# Patient Record
Sex: Female | Born: 1985 | Race: Black or African American | Hispanic: No | Marital: Single | Smoking: Former smoker
Health system: Southern US, Community
[De-identification: ages and names within clinical notes are randomized; demographics above are authoritative.]

## PROBLEM LIST (undated history)

## (undated) DIAGNOSIS — L02415 Cutaneous abscess of right lower limb: Secondary | ICD-10-CM

## (undated) DIAGNOSIS — N83209 Unspecified ovarian cyst, unspecified side: Secondary | ICD-10-CM

## (undated) DIAGNOSIS — Z789 Other specified health status: Secondary | ICD-10-CM

## (undated) DIAGNOSIS — L03115 Cellulitis of right lower limb: Secondary | ICD-10-CM

## (undated) HISTORY — DX: Cellulitis of right lower limb: L03.115

## (undated) HISTORY — PX: NO PAST SURGERIES: SHX2092

## (undated) HISTORY — DX: Cutaneous abscess of right lower limb: L02.415

---
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
## 2014-06-29 HISTORY — PX: WISDOM TOOTH EXTRACTION: SHX21

## 2015-01-02 ENCOUNTER — Encounter (HOSPITAL_BASED_OUTPATIENT_CLINIC_OR_DEPARTMENT_OTHER): Payer: Self-pay | Admitting: *Deleted

## 2015-01-02 ENCOUNTER — Emergency Department (HOSPITAL_BASED_OUTPATIENT_CLINIC_OR_DEPARTMENT_OTHER)
Admission: EM | Admit: 2015-01-02 | Discharge: 2015-01-02 | Disposition: A | Discharge status: Home / Self Care | Payer: Managed Care, Other (non HMO) | Attending: Emergency Medicine | Admitting: Emergency Medicine

## 2015-01-02 DIAGNOSIS — S39012A Strain of muscle, fascia and tendon of lower back, initial encounter: Secondary | ICD-10-CM

## 2015-01-02 DIAGNOSIS — Z720 Tobacco use: Secondary | ICD-10-CM | POA: Insufficient documentation

## 2015-01-02 DIAGNOSIS — Z8742 Personal history of other diseases of the female genital tract: Secondary | ICD-10-CM | POA: Insufficient documentation

## 2015-01-02 DIAGNOSIS — X58XXXA Exposure to other specified factors, initial encounter: Secondary | ICD-10-CM | POA: Diagnosis not present

## 2015-01-02 DIAGNOSIS — Z3202 Encounter for pregnancy test, result negative: Secondary | ICD-10-CM | POA: Insufficient documentation

## 2015-01-02 DIAGNOSIS — Y9389 Activity, other specified: Secondary | ICD-10-CM | POA: Insufficient documentation

## 2015-01-02 DIAGNOSIS — Y9289 Other specified places as the place of occurrence of the external cause: Secondary | ICD-10-CM | POA: Insufficient documentation

## 2015-01-02 DIAGNOSIS — S3992XA Unspecified injury of lower back, initial encounter: Secondary | ICD-10-CM | POA: Diagnosis present

## 2015-01-02 DIAGNOSIS — Y990 Civilian activity done for income or pay: Secondary | ICD-10-CM | POA: Insufficient documentation

## 2015-01-02 HISTORY — DX: Unspecified ovarian cyst, unspecified side: N83.209

## 2015-01-02 LAB — URINALYSIS, ROUTINE W REFLEX MICROSCOPIC
BILIRUBIN URINE: NEGATIVE
Glucose, UA: NEGATIVE mg/dL
HGB URINE DIPSTICK: NEGATIVE
Ketones, ur: NEGATIVE mg/dL
Leukocytes, UA: NEGATIVE
Nitrite: NEGATIVE
PROTEIN: NEGATIVE mg/dL
Specific Gravity, Urine: 1.024 (ref 1.005–1.030)
Urobilinogen, UA: 0.2 mg/dL (ref 0.0–1.0)
pH: 6 (ref 5.0–8.0)

## 2015-01-02 LAB — PREGNANCY, URINE: Preg Test, Ur: NEGATIVE

## 2015-01-02 MED ORDER — KETOROLAC TROMETHAMINE 60 MG/2ML IM SOLN
60.0000 mg | Freq: Once | INTRAMUSCULAR | Status: AC
  Administered 2015-01-02: 60 mg via INTRAMUSCULAR
  Filled 2015-01-02: qty 2

## 2015-01-02 MED ORDER — METHOCARBAMOL 500 MG PO TABS: 1000.0000 mg | ORAL_TABLET | Freq: Four times a day (QID) | ORAL | Status: DC

## 2015-01-02 MED ORDER — NAPROXEN 500 MG PO TABS: 500.0000 mg | ORAL_TABLET | Freq: Two times a day (BID) | ORAL | Status: DC

## 2015-01-02 NOTE — ED Notes (Signed)
Pt c/o back pain that began today while at work. Pt denies any heavy lifting lately.

## 2015-01-02 NOTE — Discharge Instructions (Signed)
Please read and follow all provided instructions.  Your diagnoses today include:  1. Lumbosacral strain, initial encounter    Tests performed today include:  Vital signs - see below for your results today  Medications prescribed:   Naproxen - anti-inflammatory pain medication  Do not exceed 500mg  naproxen every 12 hours, take with food  You have been prescribed an anti-inflammatory medication or NSAID. Take with food. Take smallest effective dose for the shortest duration needed for your pain. Stop taking if you experience stomach pain or vomiting.    Robaxin (methocarbamol) - muscle relaxer medication  DO NOT drive or perform any activities that require you to be awake and alert because this medicine can make you drowsy.   Take any prescribed medications only as directed.  Home care instructions:   Follow any educational materials contained in this packet  Please rest, use ice or heat on your back for the next several days  Do not lift, push, pull anything more than 10 pounds for the next week  Follow-up instructions: Please follow-up with your primary care provider in the next 1 week for further evaluation of your symptoms.   Return instructions:  SEEK IMMEDIATE MEDICAL ATTENTION IF YOU HAVE:  New numbness, tingling, weakness, or problem with the use of your arms or legs  Severe back pain not relieved with medications  Loss control of your bowels or bladder  Increasing pain in any areas of the body (such as chest or abdominal pain)  Shortness of breath, dizziness, or fainting.   Worsening nausea (feeling sick to your stomach), vomiting, fever, or sweats  Any other emergent concerns regarding your health   Additional Information:  Your vital signs today were: BP 141/88 mmHg   Pulse 88   Temp(Src) 98.6 F (37 C) (Oral)   Resp 18   Ht 5\' 6"  (1.676 m)   Wt 200 lb (90.719 kg)   BMI 32.30 kg/m2   SpO2 100%   LMP 11/19/2014 If your blood pressure (BP) was elevated  above 135/85 this visit, please have this repeated by your doctor within one month. --------------

## 2015-01-02 NOTE — ED Provider Notes (Signed)
CSN: [id number]     Arrival date & time 01/02/15  1717 History   First MD Initiated Contact with Patient 01/02/15 1726     Chief Complaint  Patient presents with  . Back Pain     (Consider location/radiation/quality/duration/timing/severity/associated sxs/prior Treatment) HPI Comments: Patient presents with complaint of lower back pain that began acutely today while at work. She was not doing any strenuous or physical activities when the pain started. Pain is better with standing. It is a squeezing pain across her lower back. It does not radiate into her legs. It is not associated with dysuria, fever, nausea or vomiting. No recent strenuous activity. Patient was concerned that she was having a miscarriage. No vaginal bleeding or discharge reported. Patient took a pregnancy test at home which was negative. Her last menstrual period was 11/19/14. No other treatments prior to arrival. Onset was acute. Course is constant.  Patient is a 29 y.o. female presenting with back pain. The history is provided by the patient.  Back Pain Associated symptoms: no dysuria, no fever, no numbness, no pelvic pain and no weakness     Past Medical History  Diagnosis Date  . Ovarian cyst    History reviewed. No pertinent past surgical history. No family history on file. History  Substance Use Topics  . Smoking status: Current Some Day Smoker  . Smokeless tobacco: Not on file  . Alcohol Use: Yes   OB History    No data available     Review of Systems  Constitutional: Negative for fever and unexpected weight change.  Gastrointestinal: Negative for constipation.       Negative for fecal incontinence.   Genitourinary: Negative for dysuria, hematuria, flank pain, vaginal bleeding, vaginal discharge and pelvic pain.       Negative for urinary incontinence or retention.  Musculoskeletal: Positive for back pain.  Neurological: Negative for weakness and numbness.       Denies saddle paresthesias.       Allergies  Review of patient's allergies indicates no known allergies.  Home Medications   Prior to Admission medications   Not on File   BP 141/88 mmHg  Pulse 88  Temp(Src) 98.6 F (37 C) (Oral)  Resp 18  Ht  (1.676 m)  Wt 200 lb (90.719 kg)  BMI 32.30 kg/m2  SpO2 100%  LMP 11/19/2014 Physical Exam  Constitutional: She appears well-developed and well-nourished.  HENT:  Head: Normocephalic and atraumatic.  Eyes: Conjunctivae are normal.  Neck: Normal range of motion. Neck supple.  Pulmonary/Chest: Effort normal.  Abdominal: Soft. There is no tenderness. There is no CVA tenderness.  Musculoskeletal: Normal range of motion.       Cervical back: She exhibits normal range of motion, no tenderness and no bony tenderness.       Thoracic back: She exhibits normal range of motion, no tenderness and no bony tenderness.       Lumbar back: She exhibits tenderness. She exhibits normal range of motion and no bony tenderness.       Back:  No step-off noted with palpation of spine.   Neurological: She is alert. She has normal strength and normal reflexes. No sensory deficit.  5/5 strength in entire lower extremities bilaterally. No sensation deficit.   Skin: Skin is warm and dry. No rash noted.  Psychiatric: She has a normal mood and affect.  Nursing note and vitals reviewed.   ED Course  Procedures (including critical care time) Labs Review Labs Reviewed  URINALYSIS,  ROUTINE W REFLEX MICROSCOPIC (NOT AT [facility]) - Abnormal; Notable for the following:    APPearance CLOUDY (*)    All other components within normal limits  PREGNANCY, URINE    Imaging Review No results found.   EKG Interpretation None       5:58 PM Patient seen and examined. Work-up initiated. Medications ordered.   Vital signs reviewed and are as follows: BP 141/88 mmHg  Pulse 88  Temp(Src) 98.6 F (37 C) (Oral)  Resp 18  Ht 5\' 6"  (1.676 m)  Wt 200 lb (90.719 kg)  BMI 32.30 kg/m2  SpO2  100%  LMP 11/19/2014  No red flag s/s of low back pain. Patient was counseled on back pain precautions and told to do activity as tolerated but do not lift, push, or pull heavy objects more than 10 pounds for the next week.  Patient counseled to use ice or heat on back for no longer than 15 minutes every hour.   Patient prescribed muscle relaxer and counseled on proper use of muscle relaxant medication.    Urged patient not to drink alcohol, drive, or perform any other activities that requires focus while taking this medication.  Patient urged to follow-up with PCP if pain does not improve with treatment and rest or if pain becomes recurrent. Urged to return with worsening severe pain, loss of bowel or bladder control, trouble walking.   The patient verbalizes understanding and agrees with the plan.   MDM   Final diagnoses:  Lumbosacral strain, initial encounter   Patient with back pain, exam suspicious for lumbosacral strain. UPT neg. No neurological deficits. Patient is ambulatory. No warning symptoms of back pain including: fecal incontinence, urinary retention or overflow incontinence, night sweats, waking from sleep with back pain, unexplained fevers or weight loss, h/o cancer, IVDU, recent trauma. No concern for cauda equina, epidural abscess, or other serious cause of back pain. Conservative measures such as rest, ice/heat and pain medicine indicated with PCP follow-up if no improvement with conservative management.      Renne CriglerJoshua Amiera Herzberg, PA-C 01/02/15 1833  Rolland PorterMark James, MD 01/03/15 [phone]

## 2016-11-25 ENCOUNTER — Encounter (HOSPITAL_BASED_OUTPATIENT_CLINIC_OR_DEPARTMENT_OTHER): Payer: Self-pay | Admitting: *Deleted

## 2016-11-25 ENCOUNTER — Emergency Department (HOSPITAL_BASED_OUTPATIENT_CLINIC_OR_DEPARTMENT_OTHER)
Admission: EM | Admit: 2016-11-25 | Discharge: 2016-11-25 | Disposition: A | Discharge status: Home / Self Care | Payer: 59 | Attending: Emergency Medicine | Admitting: Emergency Medicine

## 2016-11-25 DIAGNOSIS — B3731 Acute candidiasis of vulva and vagina: Secondary | ICD-10-CM

## 2016-11-25 DIAGNOSIS — B373 Candidiasis of vulva and vagina: Secondary | ICD-10-CM | POA: Diagnosis not present

## 2016-11-25 DIAGNOSIS — Z87891 Personal history of nicotine dependence: Secondary | ICD-10-CM | POA: Insufficient documentation

## 2016-11-25 DIAGNOSIS — N898 Other specified noninflammatory disorders of vagina: Secondary | ICD-10-CM | POA: Diagnosis present

## 2016-11-25 LAB — WET PREP, GENITAL
Clue Cells Wet Prep HPF POC: NONE SEEN
SPERM: NONE SEEN
Trich, Wet Prep: NONE SEEN

## 2016-11-25 LAB — URINALYSIS, ROUTINE W REFLEX MICROSCOPIC
BILIRUBIN URINE: NEGATIVE
GLUCOSE, UA: NEGATIVE mg/dL
KETONES UR: NEGATIVE mg/dL
NITRITE: NEGATIVE
PROTEIN: NEGATIVE mg/dL
Specific Gravity, Urine: 1.022 (ref 1.005–1.030)
pH: 6 (ref 5.0–8.0)

## 2016-11-25 LAB — PREGNANCY, URINE: PREG TEST UR: NEGATIVE

## 2016-11-25 LAB — URINALYSIS, MICROSCOPIC (REFLEX)

## 2016-11-25 MED ORDER — FLUCONAZOLE 200 MG PO TABS
200.0000 mg | ORAL_TABLET | Freq: Every day | ORAL | 0 refills | Status: AC
Start: 2016-11-25 — End: 2016-11-26

## 2016-11-25 MED ORDER — FLUCONAZOLE 100 MG PO TABS
200.0000 mg | ORAL_TABLET | Freq: Once | ORAL | Status: AC
  Administered 2016-11-25: 200 mg via ORAL
  Filled 2016-11-25: qty 2

## 2016-11-25 NOTE — ED Provider Notes (Signed)
[facility] Provider Note   CSN: [id number] Arrival date & time: 11/25/16  [id number] By signing my name below, I, Brenda Lane, attest that this documentation has been prepared under the direction and in the presence of Gwyneth SproutPlunkett, Zaim Nitta, MD . Electronically Signed: Elsie StainAaron Lane, ED Scribe. 11/25/2016. 7:55 PM.  History   Chief Complaint Chief Complaint  Patient presents with  . Vaginal Discharge    HPI Brenda Lane is a 31 y.o. female who presents to the Emergency Department complaining of constant, severe vaginal irritation and itching onset today. She reports associated watery vaginal discharge. No new soaps, lotions, or detergents. No OTC treatments tried for these symptoms PTA. Pt states she is sexually active, and has unprotected sex with the same partner of seven years. She has no current suspicion of STI. Pt reports her LNMP was on 10/31/16. Pt denies hx of STD.  Pt denies dysuria, hematuria, and abdominal pain.  The history is provided by the patient. No language interpreter was used.   Past Medical History:  Diagnosis Date  . Ovarian cyst     There are no active problems to display for this patient.   History reviewed. No pertinent surgical history.  OB History    No data available       Home Medications    Prior to Admission medications   Medication Sig Start Date End Date Taking? Authorizing Provider  naproxen (NAPROSYN) 500 MG tablet Take 1 tablet (500 mg total) by mouth 2 (two) times daily. 01/02/15   Renne CriglerGeiple, Joshua, PA-C    Family History No family history on file.  Social History Social History  Substance Use Topics  . Smoking status: Former Games developermoker  . Smokeless tobacco: Not on file  . Alcohol use Yes     Allergies   Patient has no known allergies.   Review of Systems Review of Systems All systems reviewed and are negative for acute change except as noted in the HPI.  Physical Exam Updated Vital Signs BP 127/84   Pulse 75    Temp 98.5 F (36.9 C) (Oral)   Resp 16   Ht 5\' 6"  (1.676 m)   Wt 254 lb (115.2 kg)   LMP 10/31/2016   SpO2 100%   BMI 41.00 kg/m   Physical Exam  Constitutional: She appears well-developed and well-nourished. No distress.  HENT:  Head: Normocephalic and atraumatic.  Eyes: Conjunctivae are normal.  Neck: Normal range of motion.  Cardiovascular: Normal rate.   Pulmonary/Chest: Effort normal. No respiratory distress.  Abdominal: She exhibits no distension. There is no rebound and no guarding.  Genitourinary:  Genitourinary Comments:  Chaperone present. Thick, white, curd-lke vaginal discharge with surrounding inflammation with and masceration of the skin. Full range of cervical motion and no masses or tenderness. No adnexal tenderness.   Musculoskeletal: Normal range of motion.  Neurological: She is alert.  Skin: No pallor.  Psychiatric: She has a normal mood and affect. Her behavior is normal.  Nursing note and vitals reviewed.   ED Treatments / Results  DIAGNOSTIC STUDIES:  Oxygen Saturation is 100% on RA, normal by my interpretation.    COORDINATION OF CARE:  8:10 PM Discussed treatment plan with pt at bedside and pt agreed to plan.  Labs (all labs ordered are listed, but only abnormal results are displayed) Labs Reviewed  WET PREP, GENITAL - Abnormal; Notable for the following:       Result Value   Yeast Wet Prep HPF POC PRESENT (*)  WBC, Wet Prep HPF POC MANY (*)    All other components within normal limits  URINALYSIS, ROUTINE W REFLEX MICROSCOPIC - Abnormal; Notable for the following:    APPearance CLOUDY (*)    Hgb urine dipstick TRACE (*)    Leukocytes, UA MODERATE (*)    All other components within normal limits  URINALYSIS, MICROSCOPIC (REFLEX) - Abnormal; Notable for the following:    Bacteria, UA RARE (*)    Squamous Epithelial / LPF 6-30 (*)    All other components within normal limits  PREGNANCY, URINE  GC/CHLAMYDIA PROBE AMP ([place]) NOT AT  [facility]    EKG  EKG Interpretation None       Radiology No results found.  Procedures Procedures (including critical care time)  Medications Ordered in ED Medications - No data to display   Initial Impression / Assessment and Plan / ED Course  I have reviewed the triage vital signs and the nursing notes.  Pertinent labs & imaging results that were available during my care of the patient were reviewed by me and considered in my medical decision making (see chart for details).     Patient with symptoms most consistent with a vaginal yeast infection. She does not have risky sexual behaviors and only 1 partner for 7 years. Patient was treated with Diflucan. GC and Chlamydia were sent but no evidence of PID and low suspicion.  Final Clinical Impressions(s) / ED Diagnoses   Final diagnoses:  Yeast vaginitis    New Prescriptions Discharge Medication List as of 11/25/2016  8:28 PM    START taking these medications   Details  fluconazole (DIFLUCAN) 200 MG tablet Take 1 tablet (200 mg total) by mouth daily. As needed in 3 days if still having symptoms., Starting Wed 11/25/2016, Until Thu 11/26/2016, Print        I personally performed the services described in this documentation, which was scribed in my presence.  The recorded information has been reviewed and considered.     Gwyneth Sprout, MD 11/25/16 2101

## 2016-11-25 NOTE — ED Notes (Signed)
Patient states she was fine when she got up this morning but by this afternoon noticed she had a very bad vaginal itch.  States she did not notice any discharge except the normal discharge but did notice some "bumps".  States she has been with 1 partner for 7 years

## 2016-11-25 NOTE — ED Notes (Signed)
Discharge instructions reviewed.

## 2016-11-25 NOTE — ED Notes (Signed)
Specimens sent to lab

## 2016-11-25 NOTE — ED Triage Notes (Signed)
pt c/o vaginal discharge and itching  X 2 days

## 2016-11-26 LAB — GC/CHLAMYDIA PROBE AMP (~~LOC~~) NOT AT ARMC
Chlamydia: NEGATIVE
Neisseria Gonorrhea: NEGATIVE

## 2018-05-10 LAB — OB RESULTS CONSOLE GC/CHLAMYDIA
Chlamydia: NEGATIVE
Gonorrhea: NEGATIVE

## 2018-06-20 LAB — OB RESULTS CONSOLE ABO/RH: RH Type: POSITIVE

## 2018-06-20 LAB — OB RESULTS CONSOLE HIV ANTIBODY (ROUTINE TESTING)
HIV: NONREACTIVE
HIV: NONREACTIVE

## 2018-06-20 LAB — OB RESULTS CONSOLE RPR: RPR: NONREACTIVE

## 2018-06-29 NOTE — L&D Delivery Note (Signed)
Delivery Note At 7:22 PM a viable and healthy female was delivered via Vaginal, Spontaneous (Presentation: Left occiput; transverse ).  APGAR: 8, 9; weight pending .   Placenta status: spontaneous, intact.  Cord: 3V   Anesthesia:  Epidural Episiotomy: None Lacerations: 1st degree Suture Repair: 3.0 vicryl Est. Blood Loss (mL):  100 cc  Mom to postpartum.  Baby to Couplet care / Skin to Skin.  Vanessa Kick 12/24/2018, 7:37 PM

## 2018-12-07 LAB — OB RESULTS CONSOLE GBS: GBS: NEGATIVE

## 2018-12-24 ENCOUNTER — Inpatient Hospital Stay (HOSPITAL_COMMUNITY): Payer: 59 | Admitting: Anesthesiology

## 2018-12-24 ENCOUNTER — Other Ambulatory Visit: Payer: Self-pay

## 2018-12-24 ENCOUNTER — Inpatient Hospital Stay (HOSPITAL_COMMUNITY)
Admission: AD | Admit: 2018-12-24 | Discharge: 2018-12-26 | DRG: 807 | Disposition: A | Discharge status: Home / Self Care | Payer: 59 | Attending: Obstetrics and Gynecology | Admitting: Obstetrics and Gynecology

## 2018-12-24 ENCOUNTER — Encounter (HOSPITAL_COMMUNITY): Payer: Self-pay

## 2018-12-24 DIAGNOSIS — Z1159 Encounter for screening for other viral diseases: Secondary | ICD-10-CM

## 2018-12-24 DIAGNOSIS — O26893 Other specified pregnancy related conditions, third trimester: Secondary | ICD-10-CM | POA: Diagnosis present

## 2018-12-24 DIAGNOSIS — O99214 Obesity complicating childbirth: Principal | ICD-10-CM | POA: Diagnosis present

## 2018-12-24 DIAGNOSIS — Z3A39 39 weeks gestation of pregnancy: Secondary | ICD-10-CM

## 2018-12-24 DIAGNOSIS — Z87891 Personal history of nicotine dependence: Secondary | ICD-10-CM | POA: Diagnosis not present

## 2018-12-24 HISTORY — DX: Other specified health status: Z78.9

## 2018-12-24 LAB — URINALYSIS, ROUTINE W REFLEX MICROSCOPIC
Bilirubin Urine: NEGATIVE
Glucose, UA: NEGATIVE mg/dL
Ketones, ur: NEGATIVE mg/dL
Nitrite: NEGATIVE
Protein, ur: NEGATIVE mg/dL
Specific Gravity, Urine: 1.02 (ref 1.005–1.030)
pH: 5 (ref 5.0–8.0)

## 2018-12-24 LAB — CBC
HCT: 37.2 % (ref 36.0–46.0)
Hemoglobin: 11.9 g/dL — ABNORMAL LOW (ref 12.0–15.0)
MCH: 24.5 pg — ABNORMAL LOW (ref 26.0–34.0)
MCHC: 32 g/dL (ref 30.0–36.0)
MCV: 76.5 fL — ABNORMAL LOW (ref 80.0–100.0)
Platelets: 161 10*3/uL (ref 150–400)
RBC: 4.86 MIL/uL (ref 3.87–5.11)
RDW: 15.2 % (ref 11.5–15.5)
WBC: 11.5 10*3/uL — ABNORMAL HIGH (ref 4.0–10.5)
nRBC: 0 % (ref 0.0–0.2)

## 2018-12-24 LAB — SARS CORONAVIRUS 2 BY RT PCR (HOSPITAL ORDER, PERFORMED IN ~~LOC~~ HOSPITAL LAB): SARS Coronavirus 2: NEGATIVE

## 2018-12-24 LAB — TYPE AND SCREEN
ABO/RH(D): A POS
Antibody Screen: NEGATIVE

## 2018-12-24 LAB — ABO/RH: ABO/RH(D): A POS

## 2018-12-24 LAB — POCT FERN TEST: POCT Fern Test: POSITIVE

## 2018-12-24 LAB — RPR: RPR Ser Ql: NONREACTIVE

## 2018-12-24 MED ORDER — SOD CITRATE-CITRIC ACID 500-334 MG/5ML PO SOLN: 30.0000 mL | ORAL | Status: DC | PRN

## 2018-12-24 MED ORDER — TETANUS-DIPHTH-ACELL PERTUSSIS 5-2.5-18.5 LF-MCG/0.5 IM SUSP: 0.5000 mL | Freq: Once | INTRAMUSCULAR | Status: DC

## 2018-12-24 MED ORDER — ONDANSETRON HCL 4 MG PO TABS: 4.0000 mg | ORAL_TABLET | ORAL | Status: DC | PRN

## 2018-12-24 MED ORDER — LACTATED RINGERS IV SOLN: 500.0000 mL | INTRAVENOUS | Status: DC | PRN

## 2018-12-24 MED ORDER — COCONUT OIL OIL: 1.0000 "application " | TOPICAL_OIL | Status: DC | PRN

## 2018-12-24 MED ORDER — LACTATED RINGERS IV SOLN
INTRAVENOUS | Status: DC
  Administered 2018-12-24: 125 mL/h via INTRAVENOUS
  Administered 2018-12-24 (×2): via INTRAVENOUS

## 2018-12-24 MED ORDER — EPHEDRINE 5 MG/ML INJ: 10.0000 mg | INTRAVENOUS | Status: DC | PRN

## 2018-12-24 MED ORDER — BUTORPHANOL TARTRATE 1 MG/ML IJ SOLN
1.0000 mg | INTRAMUSCULAR | Status: DC | PRN
  Administered 2018-12-24 (×2): 1 mg via INTRAVENOUS
  Filled 2018-12-24 (×2): qty 1

## 2018-12-24 MED ORDER — FLEET ENEMA 7-19 GM/118ML RE ENEM: 1.0000 | ENEMA | Freq: Every day | RECTAL | Status: DC | PRN

## 2018-12-24 MED ORDER — SIMETHICONE 80 MG PO CHEW: 80.0000 mg | CHEWABLE_TABLET | ORAL | Status: DC | PRN

## 2018-12-24 MED ORDER — OXYTOCIN 40 UNITS IN NORMAL SALINE INFUSION - SIMPLE MED
1.0000 m[IU]/min | INTRAVENOUS | Status: DC
  Administered 2018-12-24: 2 m[IU]/min via INTRAVENOUS
  Filled 2018-12-24: qty 1000

## 2018-12-24 MED ORDER — ACETAMINOPHEN 325 MG PO TABS: 650.0000 mg | ORAL_TABLET | ORAL | Status: DC | PRN

## 2018-12-24 MED ORDER — WITCH HAZEL-GLYCERIN EX PADS: 1.0000 "application " | MEDICATED_PAD | CUTANEOUS | Status: DC | PRN

## 2018-12-24 MED ORDER — METHYLERGONOVINE MALEATE 0.2 MG/ML IJ SOLN: 0.2000 mg | INTRAMUSCULAR | Status: DC | PRN

## 2018-12-24 MED ORDER — OXYCODONE-ACETAMINOPHEN 5-325 MG PO TABS: 1.0000 | ORAL_TABLET | ORAL | Status: DC | PRN

## 2018-12-24 MED ORDER — LACTATED RINGERS IV SOLN
500.0000 mL | Freq: Once | INTRAVENOUS | Status: AC
  Administered 2018-12-24: 06:00:00 via INTRAVENOUS

## 2018-12-24 MED ORDER — METHYLERGONOVINE MALEATE 0.2 MG PO TABS: 0.2000 mg | ORAL_TABLET | ORAL | Status: DC | PRN

## 2018-12-24 MED ORDER — DIBUCAINE (PERIANAL) 1 % EX OINT: 1.0000 "application " | TOPICAL_OINTMENT | CUTANEOUS | Status: DC | PRN

## 2018-12-24 MED ORDER — ONDANSETRON HCL 4 MG/2ML IJ SOLN
4.0000 mg | Freq: Four times a day (QID) | INTRAMUSCULAR | Status: DC | PRN
  Administered 2018-12-24: 4 mg via INTRAVENOUS
  Filled 2018-12-24: qty 2

## 2018-12-24 MED ORDER — OXYCODONE-ACETAMINOPHEN 5-325 MG PO TABS: 2.0000 | ORAL_TABLET | ORAL | Status: DC | PRN

## 2018-12-24 MED ORDER — DIPHENHYDRAMINE HCL 25 MG PO CAPS: 25.0000 mg | ORAL_CAPSULE | Freq: Four times a day (QID) | ORAL | Status: DC | PRN

## 2018-12-24 MED ORDER — FENTANYL-BUPIVACAINE-NACL 0.5-0.125-0.9 MG/250ML-% EP SOLN: 12.0000 mL/h | EPIDURAL | Status: DC | PRN

## 2018-12-24 MED ORDER — BENZOCAINE-MENTHOL 20-0.5 % EX AERO: 1.0000 "application " | INHALATION_SPRAY | CUTANEOUS | Status: DC | PRN

## 2018-12-24 MED ORDER — OXYTOCIN BOLUS FROM INFUSION
500.0000 mL | Freq: Once | INTRAVENOUS | Status: AC
  Administered 2018-12-24: 500 mL via INTRAVENOUS

## 2018-12-24 MED ORDER — OXYTOCIN 40 UNITS IN NORMAL SALINE INFUSION - SIMPLE MED
2.5000 [IU]/h | INTRAVENOUS | Status: DC
  Administered 2018-12-24: 2.5 [IU]/h via INTRAVENOUS

## 2018-12-24 MED ORDER — SENNOSIDES-DOCUSATE SODIUM 8.6-50 MG PO TABS
2.0000 | ORAL_TABLET | ORAL | Status: DC
  Administered 2018-12-24 – 2018-12-25 (×2): 2 via ORAL
  Filled 2018-12-24 (×2): qty 2

## 2018-12-24 MED ORDER — PHENYLEPHRINE 40 MCG/ML (10ML) SYRINGE FOR IV PUSH (FOR BLOOD PRESSURE SUPPORT): 80.0000 ug | PREFILLED_SYRINGE | INTRAVENOUS | Status: DC | PRN

## 2018-12-24 MED ORDER — ONDANSETRON HCL 4 MG/2ML IJ SOLN: 4.0000 mg | INTRAMUSCULAR | Status: DC | PRN

## 2018-12-24 MED ORDER — TERBUTALINE SULFATE 1 MG/ML IJ SOLN: 0.2500 mg | Freq: Once | INTRAMUSCULAR | Status: DC | PRN

## 2018-12-24 MED ORDER — DIPHENHYDRAMINE HCL 50 MG/ML IJ SOLN: 12.5000 mg | INTRAMUSCULAR | Status: DC | PRN

## 2018-12-24 MED ORDER — LIDOCAINE HCL (PF) 1 % IJ SOLN
INTRAMUSCULAR | Status: DC | PRN
  Administered 2018-12-24: 11 mL via EPIDURAL

## 2018-12-24 MED ORDER — LIDOCAINE HCL (PF) 1 % IJ SOLN: 30.0000 mL | INTRAMUSCULAR | Status: DC | PRN

## 2018-12-24 MED ORDER — FENTANYL-BUPIVACAINE-NACL 0.5-0.125-0.9 MG/250ML-% EP SOLN
EPIDURAL | Status: AC
  Filled 2018-12-24: qty 250

## 2018-12-24 MED ORDER — PRENATAL MULTIVITAMIN CH
1.0000 | ORAL_TABLET | Freq: Every day | ORAL | Status: DC
  Administered 2018-12-25 – 2018-12-26 (×2): 1 via ORAL
  Filled 2018-12-24 (×2): qty 1

## 2018-12-24 MED ORDER — IBUPROFEN 600 MG PO TABS
600.0000 mg | ORAL_TABLET | Freq: Four times a day (QID) | ORAL | Status: DC
  Administered 2018-12-24 – 2018-12-26 (×7): 600 mg via ORAL
  Filled 2018-12-24 (×8): qty 1

## 2018-12-24 MED ORDER — ACETAMINOPHEN 325 MG PO TABS
650.0000 mg | ORAL_TABLET | ORAL | Status: DC | PRN
  Administered 2018-12-26: 650 mg via ORAL
  Filled 2018-12-24: qty 2

## 2018-12-24 MED ORDER — SODIUM CHLORIDE (PF) 0.9 % IJ SOLN
INTRAMUSCULAR | Status: DC | PRN
  Administered 2018-12-24: 12 mL/h via EPIDURAL

## 2018-12-24 MED ORDER — ZOLPIDEM TARTRATE 5 MG PO TABS: 5.0000 mg | ORAL_TABLET | Freq: Every evening | ORAL | Status: DC | PRN

## 2018-12-24 NOTE — Anesthesia Procedure Notes (Signed)
Epidural Patient location during procedure: OB Start time: 12/24/2018 6:08 AM End time: 12/24/2018 6:23 AM  Staffing Anesthesiologist: Lynda Rainwater, MD Performed: anesthesiologist   Preanesthetic Checklist Completed: patient identified, site marked, surgical consent, pre-op evaluation, timeout performed, IV checked, risks and benefits discussed and monitors and equipment checked  Epidural Patient position: sitting Prep: ChloraPrep Patient monitoring: heart rate, cardiac monitor, continuous pulse ox and blood pressure Approach: midline Location: L2-L3 Injection technique: LOR saline  Needle:  Needle type: Tuohy  Needle gauge: 17 G Needle length: 9 cm Needle insertion depth: 9 cm Catheter type: closed end flexible Catheter size: 20 Guage Catheter at skin depth: 14 cm Test dose: negative  Assessment Events: blood not aspirated, injection not painful, no injection resistance, negative IV test and no paresthesia  Additional Notes Reason for block:procedure for pain

## 2018-12-24 NOTE — MAU Note (Signed)
PT states she felt warm fluid coming when she was using the bathroom around 2315.

## 2018-12-24 NOTE — Anesthesia Preprocedure Evaluation (Signed)
Anesthesia Evaluation  Patient identified by MRN, date of birth, ID band Patient awake    Reviewed: Allergy & Precautions, NPO status , Patient's Chart, lab work & pertinent test results  Airway Mallampati: II  TM Distance: >3 FB Neck ROM: Full    Dental no notable dental hx.    Pulmonary neg pulmonary ROS, former smoker,    Pulmonary exam normal breath sounds clear to auscultation       Cardiovascular negative cardio ROS Normal cardiovascular exam Rhythm:Regular Rate:Normal     Neuro/Psych negative neurological ROS  negative psych ROS   GI/Hepatic negative GI ROS, Neg liver ROS,   Endo/Other  Morbid obesity  Renal/GU negative Renal ROS  negative genitourinary   Musculoskeletal negative musculoskeletal ROS (+)   Abdominal (+) + obese,   Peds negative pediatric ROS (+)  Hematology negative hematology ROS (+)   Anesthesia Other Findings   Reproductive/Obstetrics (+) Pregnancy                             Anesthesia Physical Anesthesia Plan  ASA: III  Anesthesia Plan: Epidural   Post-op Pain Management:    Induction:   PONV Risk Score and Plan:   Airway Management Planned:   Additional Equipment:   Intra-op Plan:   Post-operative Plan:   Informed Consent:   Plan Discussed with:   Anesthesia Plan Comments:         Anesthesia Quick Evaluation

## 2018-12-24 NOTE — MAU Note (Signed)
Pt stated her mucus plug came out while she was on the toilet and then a lot of warm fluid came out. Still is wet. Having some contractions . Good fetal movement felt.

## 2018-12-24 NOTE — H&P (Signed)
Brenda Lane is a 33 y.o. female presenting for leaking fluid  33 yo G1P0 @ 39+2 presents for LOF and confirmed SROM. Her pregnancy has been complicated by maternal obesity. She has been on ASA 81 mg for this.  OB History    Gravida  1   Para      Term      Preterm      AB      Living        SAB      TAB      Ectopic      Multiple      Live Births             Past Medical History:  Diagnosis Date  . Medical history non-contributory   . Ovarian cyst    Past Surgical History:  Procedure Laterality Date  . NO PAST SURGERIES     Family History: family history is not on file. Social History:  reports that she has quit smoking. She does not have any smokeless tobacco history on file. She reports current alcohol use. She reports that she does not use drugs.     Maternal Diabetes: No Genetic Screening: Normal Maternal Ultrasounds/Referrals: Normal Fetal Ultrasounds or other Referrals:  None Maternal Substance Abuse:  No Significant Maternal Medications:  None Significant Maternal Lab Results:  None Other Comments:  None  ROS History Dilation: 6 Effacement (%): 80 Station: -3 Exam by:: Brenda Lane Blood pressure 125/72, pulse (!) 111, temperature 98.1 F (36.7 C), temperature source Oral, resp. rate 16, height 5\' 6"  (1.676 m), weight 114.1 kg, SpO2 98 %. Exam Physical Exam  Prenatal labs: ABO, Rh: --/--/A POS, A POS Performed at [facility], [place] [address], [place], [place] [address]  [phone]/27 0125) Antibody: NEG (06/27 0125) Rubella:  immune RPR: Nonreactive (12/23 0000)  HBsAg:   negative HIV: Non-reactive, Non-reactive (12/23 0000)  GBS: Negative (06/10 0000)   Assessment/Plan: 1) Admit 2) Epidural on request     Vanessa Kick 12/24/2018, 10:08 AM

## 2018-12-25 LAB — CBC
HCT: 33.8 % — ABNORMAL LOW (ref 36.0–46.0)
Hemoglobin: 10.8 g/dL — ABNORMAL LOW (ref 12.0–15.0)
MCH: 24.3 pg — ABNORMAL LOW (ref 26.0–34.0)
MCHC: 32 g/dL (ref 30.0–36.0)
MCV: 76.1 fL — ABNORMAL LOW (ref 80.0–100.0)
Platelets: 144 10*3/uL — ABNORMAL LOW (ref 150–400)
RBC: 4.44 MIL/uL (ref 3.87–5.11)
RDW: 15.3 % (ref 11.5–15.5)
WBC: 21.8 10*3/uL — ABNORMAL HIGH (ref 4.0–10.5)
nRBC: 0 % (ref 0.0–0.2)

## 2018-12-25 NOTE — Progress Notes (Signed)
Post Partum Day 1 Subjective: no complaints, up ad lib, voiding and tolerating PO  Objective: Blood pressure 139/73, pulse 75, temperature 98.7 F (37.1 C), temperature source Oral, resp. rate 18, height 5\' 6"  (1.676 m), weight 114.1 kg, SpO2 99 %, unknown if currently breastfeeding.  Physical Exam:  General: alert, cooperative and appears stated age 33: appropriate Uterine Fundus: firm DVT Evaluation: No evidence of DVT seen on physical exam.  Recent Labs    12/24/18 0125 12/25/18 0508  HGB 11.9* 10.8*  HCT 37.2 33.8*    Assessment/Plan: Plan for discharge tomorrow and Breastfeeding  Desires neonatal circumcision, R/B/A of procedure discussed at length. Pt understands that neonatal circumcision is not considered medically necessary and is elective. The risks include, but are not limited to bleeding, infection, damage to the penis, development of scar tissue, and having to have it redone at a later date. Pt understands theses risks and wishes to proceed Baby feeding poorly, will postpone circ until tomorrow   LOS: 1 day   Vanessa Kick 12/25/2018, 11:43 AM

## 2018-12-25 NOTE — Anesthesia Postprocedure Evaluation (Signed)
Anesthesia Post Note  Patient: Brenda Lane  Procedure(s) Performed: AN AD HOC LABOR EPIDURAL     Patient location during evaluation: Mother Baby Anesthesia Type: Epidural Level of consciousness: awake and alert, oriented and patient cooperative Pain management: pain level controlled Vital Signs Assessment: post-procedure vital signs reviewed and stable Respiratory status: spontaneous breathing Cardiovascular status: stable Postop Assessment: no headache, epidural receding, patient able to bend at knees, no signs of nausea or vomiting and able to ambulate Anesthetic complications: no Comments: Pt. Interviewed via phone consultation d/t COVID 19 precautions.  Pt. States she is walking and pain score is 0.      Last Vitals:  Vitals:   12/24/18 2258 12/25/18 0524  BP: (!) 115/92 131/69  Pulse: 99 86  Resp: 18 16  Temp: 37.1 C 36.8 C  SpO2:      Last Pain:  Vitals:   12/25/18 0524  TempSrc: Oral  PainSc:    Pain Goal:                   [facility]

## 2018-12-25 NOTE — Lactation Note (Signed)
This note was copied from a baby's chart. Lactation Consultation Note  Patient Name: Brenda Lane Lady SLHTD'S Date: 12/25/2018 Reason for consult: 1st time breastfeeding;Term;Difficult latch P1, 9 hour female infant. Per mom, infant has not latched to breast since birth. Mom is active on the  [facility] program  in [place]. Infant had one stool since delivery. Per mom, Nurse had given her 24 mm NS but infant was not latching with or without it. Mom taught back hand expression and colostrum is present in breast. LC notice mom has large breast and large nipples that are flat. LC ask mom pre-pump with harmony hand pump prior to applying 24 mm NS. Mom shown how to use hand pump  & how to disassemble, clean, & reassemble parts. Mom latched infant to left breast using football hold, 24 mm NS was pre-filled with 1 ml of Gerber Good start formula using a curve tip syringe. Infant sustained latch, wide mouth gape and breastfeed for 20 minutes. Infant was supplemented with another 4 ml of formula using a curve tip syringe. Mom knows to breastfeed infant according hunger cues, 8 to 12 times within 24 hours and breastfeed on demand. Mom knows to call Nurse or [place] if she has any questions, concerns or need assistance with latching infant to breast. LC discussed I & O. Reviewed Baby & Me book's Breastfeeding Basics.  Mom made aware of O/P services, breastfeeding support groups, community resources, and our phone # for post-discharge questions.   Maternal Data Formula Feeding for Exclusion: Yes Reason for exclusion: Mother's choice to formula and breast feed on admission Has patient been taught Hand Expression?: Yes(colostrum present in breast.) Does the patient have breastfeeding experience prior to this delivery?: No  Feeding Feeding Type: Breast Fed  LATCH Score Latch: Grasps breast easily, tongue down, lips flanged, rhythmical sucking.  Audible Swallowing: A few with stimulation  Type of  Nipple: Flat  Comfort (Breast/Nipple): Soft / non-tender  Hold (Positioning): Assistance needed to correctly position infant at breast and maintain latch.  LATCH Score: 7  Interventions Interventions: Breast feeding basics reviewed;Breast compression;Assisted with latch;Adjust position;Skin to skin;Support pillows;Hand pump;Breast massage;Position options;Hand express;Expressed milk;Pre-pump if needed  Lactation Tools Discussed/Used Tools: Shells;Nipple Shields Nipple shield size: 24(NS was given by Nurse.) [facility] Program: Yes Pump Review: Setup, frequency, and cleaning Initiated by:: Vicente Serene Date initiated:: 12/25/18   Consult Status Consult Status: Follow-up Date: 12/25/18 Follow-up type: In-patient    Vicente Serene 12/25/2018, 4:30 AM

## 2018-12-25 NOTE — Lactation Note (Signed)
This note was copied from a baby's chart. Lactation Consultation Note  Patient Name: Brenda Lane [id number] Date: 12/25/2018 Reason for consult: Follow-up assessment;Term;Primapara;1st time breastfeeding  P1 mother whose infant is now 44 hours old.   Mother was changing baby when I arrived.  Offered to assist with latching and mother accepted.  Mother's breasts are large, soft and non tender and nipples are short shafted and intact.  Reviewed hand expression and mother was able to obtain a couple drops of colostrum which I finger fed back to baby.  Mother is also familiar with using the spoon.  Attempted to latch in the football hold on the left breast.  Baby would open wide and latch but showed no interest in sucking.  Gentle stimulation performed but no initiative to suck.  Demonstrated breast compressions for mother.  Reassured her that this is typical behavior for a baby at this age.  Encouraged her to do STS and to continue working on hand expression.  Suggested she pump with the DEBP after feeding attempts.  Mother agreeable.  Reviewed the pump and pump set up with mother.  Discussed breast feeding basics while she pumped.  Mother will feed back any EBM she obtains.  Mother will be a "stay at home" mother for 14 weeks and has a DEBP for home use.  Advised her on how she can make a "hands free" bra and how breast compressions during pumping can help increase her milk supply.  Mother will put her bra on after pumping and begin using breast shells to help evert her nipples.  She has a NS at bedside but is really not interested in using it if she does not have to.  Encouraged to continue watching for feeding cues and to call for latch assistance as needed.  Mother verbalized understanding.   Maternal Data Formula Feeding for Exclusion: No Has patient been taught Hand Expression?: Yes Does the patient have breastfeeding experience prior to this delivery?: No  Feeding Feeding Type:  Breast Fed  LATCH Score Latch: Too sleepy or reluctant, no latch achieved, no sucking elicited.  Audible Swallowing: None  Type of Nipple: Everted at rest and after stimulation(short shafted)  Comfort (Breast/Nipple): Soft / non-tender  Hold (Positioning): Assistance needed to correctly position infant at breast and maintain latch.  LATCH Score: 5  Interventions Interventions: Breast feeding basics reviewed;Assisted with latch;Skin to skin;Breast massage;Hand express;Breast compression;Adjust position;DEBP;Hand pump;Shells;Position options;Support pillows  Lactation Tools Discussed/Used Tools: Shells;Pump Shell Type: Inverted Breast pump type: Double-Electric Breast Pump;Manual Pump Review: Setup, frequency, and cleaning(Review) Initiated by:: Alvira Monday RN  Date initiated:: 12/25/18   Consult Status Consult Status: Follow-up Date: 12/26/18 Follow-up type: In-patient    [name] 12/25/2018, 4:52 PM

## 2018-12-26 NOTE — Plan of Care (Signed)
  Problem: Skin Integrity: Goal: Risk for impaired skin integrity will decrease Outcome: Completed/Met   Problem: Education: Goal: Knowledge of condition will improve Outcome: Completed/Met   Problem: Activity: Goal: Will verbalize the importance of balancing activity with adequate rest periods Outcome: Completed/Met   Problem: Activity: Goal: Ability to tolerate increased activity will improve Outcome: Completed/Met   Problem: Coping: Goal: Ability to identify and utilize available resources and services will improve Outcome: Completed/Met   Problem: Life Cycle: Goal: Chance of risk for complications during the postpartum period will decrease Outcome: Completed/Met   Problem: Role Relationship: Goal: Ability to demonstrate positive interaction with newborn will improve Outcome: Completed/Met   Problem: Skin Integrity: Goal: Demonstration of wound healing without infection will improve Outcome: Completed/Met

## 2018-12-26 NOTE — Discharge Summary (Signed)
Obstetric Discharge Summary Reason for Admission: rupture of membranes Prenatal Procedures: ultrasound Intrapartum Procedures: spontaneous vaginal delivery Postpartum Procedures: none Complications-Operative and Postpartum: 1st degree perineal laceration Hemoglobin  Date Value Ref Range Status  12/25/2018 10.8 (L) 12.0 - 15.0 g/dL Final   HCT  Date Value Ref Range Status  12/25/2018 33.8 (L) 36.0 - 46.0 % Final    Physical Exam:  General: alert and cooperative Lochia: appropriate Uterine Fundus: firm DVT Evaluation: No evidence of DVT seen on physical exam.  Discharge Diagnoses: Term Pregnancy-delivered  Discharge Information: Date: 12/26/2018 Activity: pelvic rest Diet: routine Medications: PNV and Ibuprofen Condition: stable Instructions: refer to practice specific booklet Discharge to: home Follow-up Information    Vanessa Kick, MD Follow up in 4 week(s).   Specialty: Obstetrics and Gynecology Contact information: [address] [place] [address] [phone]           Newborn Data: Live born female  Birth Weight: 7 lb 5.1 oz (3320 g) APGAR: 59, 9  Newborn Delivery   Birth date/time: 12/24/2018 19:22:00 Delivery type: Vaginal, Spontaneous      Home with mother.  Allyn Kenner 12/26/2018, 10:11 AM

## 2018-12-26 NOTE — Lactation Note (Signed)
This note was copied from a baby's chart. Lactation Consultation Note Baby 34 hrs. Will not latch. Mom has been giving formula. Mom has large breast. Lt. Breast w/edema. Reverse pressure to areola around semi flat nipple. Rt. Breast softer for latch. Baby would not suckle except a few times. Tongue thrusting pushing nipple out of mouth. Baby sucking on his recessed bottle lip. #24 NS applied baby would not suck. Clamps down and bites. Keeps drawing bottom lip in. Frequently pulled out. Mom is going to pump and bottle feed. Mom is going to cont, to give formula until her milk comes in. Discussed engorgement, pumping, breast massage, hands free bra, milk storage, I&O, supply and demand. Encouraged to call if changes her mind and needs assistance.  Patient Name: Brenda Lane [id number] Date: 12/26/2018 Reason for consult: Follow-up assessment;Difficult latch;1st time breastfeeding   Maternal Data    Feeding Feeding Type: Formula Nipple Type: Slow - flow  LATCH Score Latch: Too sleepy or reluctant, no latch achieved, no sucking elicited.  Audible Swallowing: None  Type of Nipple: Flat  Comfort (Breast/Nipple): Filling, red/small blisters or bruises, mild/mod discomfort(edema)  Hold (Positioning): Full assist, staff holds infant at breast  LATCH Score: 2  Interventions Interventions: Breast feeding basics reviewed;Adjust position;DEBP;Assisted with latch;Support pillows;Skin to skin;Position options;Breast massage;Reverse pressure;Breast compression  Lactation Tools Discussed/Used Tools: Pump;Nipple Shields Nipple shield size: 24 Shell Type: Inverted Breast pump type: Double-Electric Breast Pump   Consult Status Consult Status: Complete Date: 12/26/18    Theodoro Kalata 12/26/2018, 5:39 AM

## 2019-06-30 NOTE — L&D Delivery Note (Signed)
Delivery Note Brenda Lane is a G2P1001 at [redacted]w[redacted]d who had a spontaneous delivery at  a viable female was delivered via right occiput anterior.  APGAR: 9/9 ; weight 7lb 5.6oz  .    Admitted for elective IOL.  Induced with pitocin. Progressed normally.  Pushed for 28 minutes. Baby was delivered without difficulty. Tight nuchal cord x 1.  Delayed cord clamping for 60 seconds.  Delivery of placenta was spontaneous. Placenta was found to be intact 3-vessel cord was noted. The fundus was found to be firm. No lacerations.  Instrument and gauze counts were correct at the end of the procedure.  Anesthesia: Epidural Episiotomy:  None Lacerations:  None Suture Repair: N/A Est. Blood Loss (mL):   Mom to postpartum.  Baby to Couplet care / Skin to Skin.  Charlett Nose 04/26/2020, 11:28 PM

## 2019-10-11 LAB — OB RESULTS CONSOLE GC/CHLAMYDIA
Chlamydia: NEGATIVE
Gonorrhea: NEGATIVE

## 2019-10-11 LAB — OB RESULTS CONSOLE ABO/RH: RH Type: POSITIVE

## 2019-10-11 LAB — OB RESULTS CONSOLE ANTIBODY SCREEN: Antibody Screen: NEGATIVE

## 2019-10-11 LAB — OB RESULTS CONSOLE RUBELLA ANTIBODY, IGM: Rubella: IMMUNE

## 2019-10-11 LAB — OB RESULTS CONSOLE HIV ANTIBODY (ROUTINE TESTING): HIV: NONREACTIVE

## 2019-10-11 LAB — OB RESULTS CONSOLE RPR: RPR: NONREACTIVE

## 2019-10-11 LAB — OB RESULTS CONSOLE HEPATITIS B SURFACE ANTIGEN: Hepatitis B Surface Ag: NEGATIVE

## 2020-02-16 ENCOUNTER — Other Ambulatory Visit: Payer: Self-pay | Admitting: Obstetrics and Gynecology

## 2020-02-16 DIAGNOSIS — Z3A30 30 weeks gestation of pregnancy: Secondary | ICD-10-CM

## 2020-02-16 DIAGNOSIS — Z363 Encounter for antenatal screening for malformations: Secondary | ICD-10-CM

## 2020-02-16 DIAGNOSIS — O403XX0 Polyhydramnios, third trimester, not applicable or unspecified: Secondary | ICD-10-CM

## 2020-02-21 ENCOUNTER — Encounter: Payer: Self-pay | Admitting: *Deleted

## 2020-02-22 ENCOUNTER — Ambulatory Visit: Payer: 59 | Attending: Obstetrics and Gynecology

## 2020-02-22 ENCOUNTER — Ambulatory Visit: Payer: 59

## 2020-03-13 ENCOUNTER — Other Ambulatory Visit: Payer: Self-pay

## 2020-03-13 ENCOUNTER — Ambulatory Visit: Payer: 59

## 2020-03-13 ENCOUNTER — Ambulatory Visit: Payer: 59 | Attending: Obstetrics and Gynecology

## 2020-03-13 ENCOUNTER — Ambulatory Visit: Payer: 59 | Admitting: *Deleted

## 2020-03-13 VITALS — BP 118/59 | HR 90

## 2020-03-13 DIAGNOSIS — Z3A30 30 weeks gestation of pregnancy: Secondary | ICD-10-CM | POA: Diagnosis present

## 2020-03-13 DIAGNOSIS — O403XX0 Polyhydramnios, third trimester, not applicable or unspecified: Secondary | ICD-10-CM | POA: Diagnosis not present

## 2020-03-13 DIAGNOSIS — O409XX0 Polyhydramnios, unspecified trimester, not applicable or unspecified: Secondary | ICD-10-CM | POA: Insufficient documentation

## 2020-03-13 DIAGNOSIS — Z363 Encounter for antenatal screening for malformations: Secondary | ICD-10-CM | POA: Diagnosis not present

## 2020-04-05 LAB — OB RESULTS CONSOLE GBS: GBS: POSITIVE

## 2020-04-23 ENCOUNTER — Telehealth (HOSPITAL_COMMUNITY): Payer: Self-pay | Admitting: *Deleted

## 2020-04-23 NOTE — Telephone Encounter (Signed)
Preadmission screen  

## 2020-04-24 ENCOUNTER — Other Ambulatory Visit (HOSPITAL_COMMUNITY)
Admission: RE | Admit: 2020-04-24 | Discharge: 2020-04-24 | Disposition: A | Discharge status: Home / Self Care | Payer: 59 | Source: Ambulatory Visit | Attending: Obstetrics and Gynecology | Admitting: Obstetrics and Gynecology

## 2020-04-24 DIAGNOSIS — Z01818 Encounter for other preprocedural examination: Secondary | ICD-10-CM | POA: Insufficient documentation

## 2020-04-24 DIAGNOSIS — Z20822 Contact with and (suspected) exposure to covid-19: Secondary | ICD-10-CM | POA: Insufficient documentation

## 2020-04-24 LAB — SARS CORONAVIRUS 2 (TAT 6-24 HRS): SARS Coronavirus 2: NEGATIVE

## 2020-04-26 ENCOUNTER — Inpatient Hospital Stay (HOSPITAL_COMMUNITY)
Admission: AD | Admit: 2020-04-26 | Discharge: 2020-04-28 | DRG: 807 | Disposition: A | Discharge status: Home / Self Care | Payer: 59 | Attending: Obstetrics and Gynecology | Admitting: Obstetrics and Gynecology

## 2020-04-26 ENCOUNTER — Inpatient Hospital Stay (HOSPITAL_COMMUNITY): Payer: 59 | Admitting: Anesthesiology

## 2020-04-26 ENCOUNTER — Other Ambulatory Visit: Payer: Self-pay

## 2020-04-26 ENCOUNTER — Inpatient Hospital Stay (HOSPITAL_COMMUNITY): Payer: 59

## 2020-04-26 ENCOUNTER — Encounter (HOSPITAL_COMMUNITY): Payer: Self-pay | Admitting: Obstetrics and Gynecology

## 2020-04-26 DIAGNOSIS — O26893 Other specified pregnancy related conditions, third trimester: Secondary | ICD-10-CM | POA: Diagnosis present

## 2020-04-26 DIAGNOSIS — Z87891 Personal history of nicotine dependence: Secondary | ICD-10-CM

## 2020-04-26 DIAGNOSIS — Z3A39 39 weeks gestation of pregnancy: Secondary | ICD-10-CM

## 2020-04-26 DIAGNOSIS — Z20822 Contact with and (suspected) exposure to covid-19: Secondary | ICD-10-CM | POA: Diagnosis present

## 2020-04-26 DIAGNOSIS — O3663X0 Maternal care for excessive fetal growth, third trimester, not applicable or unspecified: Principal | ICD-10-CM | POA: Diagnosis present

## 2020-04-26 DIAGNOSIS — Z3490 Encounter for supervision of normal pregnancy, unspecified, unspecified trimester: Secondary | ICD-10-CM | POA: Diagnosis present

## 2020-04-26 DIAGNOSIS — O99214 Obesity complicating childbirth: Secondary | ICD-10-CM | POA: Diagnosis present

## 2020-04-26 DIAGNOSIS — O99824 Streptococcus B carrier state complicating childbirth: Secondary | ICD-10-CM | POA: Diagnosis present

## 2020-04-26 LAB — CBC
HCT: 37.4 % (ref 36.0–46.0)
Hemoglobin: 11.6 g/dL — ABNORMAL LOW (ref 12.0–15.0)
MCH: 24 pg — ABNORMAL LOW (ref 26.0–34.0)
MCHC: 31 g/dL (ref 30.0–36.0)
MCV: 77.4 fL — ABNORMAL LOW (ref 80.0–100.0)
Platelets: 166 10*3/uL (ref 150–400)
RBC: 4.83 MIL/uL (ref 3.87–5.11)
RDW: 14.7 % (ref 11.5–15.5)
WBC: 11.5 10*3/uL — ABNORMAL HIGH (ref 4.0–10.5)
nRBC: 0 % (ref 0.0–0.2)

## 2020-04-26 LAB — TYPE AND SCREEN
ABO/RH(D): A POS
Antibody Screen: NEGATIVE

## 2020-04-26 LAB — RPR: RPR Ser Ql: NONREACTIVE

## 2020-04-26 MED ORDER — ACETAMINOPHEN 325 MG PO TABS: 650.0000 mg | ORAL_TABLET | ORAL | Status: DC | PRN

## 2020-04-26 MED ORDER — LACTATED RINGERS IV SOLN: INTRAVENOUS | Status: DC

## 2020-04-26 MED ORDER — LIDOCAINE HCL (PF) 1 % IJ SOLN
INTRAMUSCULAR | Status: DC | PRN
  Administered 2020-04-26: 8 mL via EPIDURAL

## 2020-04-26 MED ORDER — LACTATED RINGERS IV SOLN: 500.0000 mL | Freq: Once | INTRAVENOUS | Status: DC

## 2020-04-26 MED ORDER — OXYTOCIN-SODIUM CHLORIDE 30-0.9 UT/500ML-% IV SOLN
1.0000 m[IU]/min | INTRAVENOUS | Status: DC
  Administered 2020-04-26 (×2): 2 m[IU]/min via INTRAVENOUS
  Filled 2020-04-26: qty 500

## 2020-04-26 MED ORDER — PHENYLEPHRINE 40 MCG/ML (10ML) SYRINGE FOR IV PUSH (FOR BLOOD PRESSURE SUPPORT): 80.0000 ug | PREFILLED_SYRINGE | INTRAVENOUS | Status: DC | PRN

## 2020-04-26 MED ORDER — LACTATED RINGERS AMNIOINFUSION: INTRAVENOUS | Status: DC

## 2020-04-26 MED ORDER — EPHEDRINE 5 MG/ML INJ: 10.0000 mg | INTRAVENOUS | Status: DC | PRN

## 2020-04-26 MED ORDER — SODIUM CHLORIDE (PF) 0.9 % IJ SOLN
INTRAMUSCULAR | Status: DC | PRN
  Administered 2020-04-26: 12 mL/h via EPIDURAL

## 2020-04-26 MED ORDER — LACTATED RINGERS AMNIOINFUSION
INTRAVENOUS | Status: DC
  Administered 2020-04-26: 250 mL via INTRAUTERINE

## 2020-04-26 MED ORDER — FENTANYL-BUPIVACAINE-NACL 0.5-0.125-0.9 MG/250ML-% EP SOLN
12.0000 mL/h | EPIDURAL | Status: DC | PRN
  Filled 2020-04-26: qty 250

## 2020-04-26 MED ORDER — SODIUM CHLORIDE 0.9 % IV SOLN
5.0000 10*6.[IU] | Freq: Once | INTRAVENOUS | Status: AC
  Administered 2020-04-26: 5 10*6.[IU] via INTRAVENOUS
  Filled 2020-04-26: qty 5

## 2020-04-26 MED ORDER — TERBUTALINE SULFATE 1 MG/ML IJ SOLN
0.2500 mg | Freq: Once | INTRAMUSCULAR | Status: AC | PRN
  Administered 2020-04-26: 0.25 mg via SUBCUTANEOUS
  Filled 2020-04-26: qty 1

## 2020-04-26 MED ORDER — ONDANSETRON HCL 4 MG/2ML IJ SOLN: 4.0000 mg | Freq: Four times a day (QID) | INTRAMUSCULAR | Status: DC | PRN

## 2020-04-26 MED ORDER — OXYCODONE-ACETAMINOPHEN 5-325 MG PO TABS: 2.0000 | ORAL_TABLET | ORAL | Status: DC | PRN

## 2020-04-26 MED ORDER — SOD CITRATE-CITRIC ACID 500-334 MG/5ML PO SOLN: 30.0000 mL | ORAL | Status: DC | PRN

## 2020-04-26 MED ORDER — FENTANYL CITRATE (PF) 100 MCG/2ML IJ SOLN
50.0000 ug | INTRAMUSCULAR | Status: DC | PRN
  Administered 2020-04-26: 100 ug via INTRAVENOUS
  Filled 2020-04-26: qty 2

## 2020-04-26 MED ORDER — LACTATED RINGERS IV SOLN: 500.0000 mL | INTRAVENOUS | Status: DC | PRN

## 2020-04-26 MED ORDER — OXYTOCIN BOLUS FROM INFUSION
333.0000 mL | Freq: Once | INTRAVENOUS | Status: AC
  Administered 2020-04-26: 333 mL via INTRAVENOUS

## 2020-04-26 MED ORDER — PENICILLIN G POT IN DEXTROSE 60000 UNIT/ML IV SOLN
3.0000 10*6.[IU] | INTRAVENOUS | Status: DC
  Administered 2020-04-26 (×2): 3 10*6.[IU] via INTRAVENOUS
  Filled 2020-04-26: qty 50

## 2020-04-26 MED ORDER — LIDOCAINE HCL (PF) 1 % IJ SOLN: 30.0000 mL | INTRAMUSCULAR | Status: DC | PRN

## 2020-04-26 MED ORDER — DIPHENHYDRAMINE HCL 50 MG/ML IJ SOLN: 12.5000 mg | INTRAMUSCULAR | Status: DC | PRN

## 2020-04-26 MED ORDER — OXYCODONE-ACETAMINOPHEN 5-325 MG PO TABS: 1.0000 | ORAL_TABLET | ORAL | Status: DC | PRN

## 2020-04-26 MED ORDER — OXYTOCIN-SODIUM CHLORIDE 30-0.9 UT/500ML-% IV SOLN: 2.5000 [IU]/h | INTRAVENOUS | Status: DC

## 2020-04-26 NOTE — H&P (Addendum)
Brenda Lane is a 34 y.o. female G2P1001 [redacted]w[redacted]d presenting for elective IOL. She reports no LOF, VB. Mild Contractions. Normal FM.   Pregnancy c/b: 1. Obesity (prepregnancy BMI 39.6) 2. Polyhydramnios - resolved 3. GBS +  OB History    Gravida  2   Para  1   Term  1   Preterm      AB      Living  1     SAB      TAB      Ectopic      Multiple  0   Live Births  1          Past Medical History:  Diagnosis Date  . Cellulitis and abscess of right lower extremity   . Medical history non-contributory   . Ovarian cyst    Past Surgical History:  Procedure Laterality Date  . LEG SURGERY  2007   Rt leg for cellulitis  . NO PAST SURGERIES    . WISDOM TOOTH EXTRACTION  2016   Family History: family history is not on file. Social History:  reports that she has quit smoking. She has never used smokeless tobacco. She reports current alcohol use. She reports that she does not use drugs.     Maternal Diabetes: No Genetic Screening: Normal Maternal Ultrasounds/Referrals: Normal Fetal Ultrasounds or other Referrals:  Other: Growth [place] on 10/22, EFW 9lb0oz (>90%) Maternal Substance Abuse:  No Significant Maternal Medications:  None Significant Maternal Lab Results:  Group B Strep positive Other Comments:  None  Review of Systems Per HPI Exam Physical Exam  Dilation: 3 Effacement (%): Thick Station: Ballotable Exam by:: Dr. Timothy Lasso Blood pressure 126/85, pulse 78, resp. rate 16, last menstrual period 07/26/2019, unknown if currently breastfeeding. NAD, resting comfortably,  Gravid abdomen, EFW by Leopold's 8.5lbs Fetal testing: FHR 130bpm, moderate variability, + accels, no decels. Toco: ctx q 3-4 mins Vertex by bedside US Prenatal labs: ABO, Rh:  --/--/A POS (10/29 0850) Antibody: NEG (10/29 0850) Rubella: Immune (04/14 0000) RPR: Nonreactive (04/14 0000)  HBsAg: Negative (04/14 0000)  HIV: Non-reactive (04/14 0000)  GBS: Positive/-- (10/08 0000)    Assessment/Plan: 34Y G2P1001 @ 39.2, admitted for elective IOL 1. IOL: Pitocin, plan to AROM once adequately treated for GBS and fetal station well engaged 2. GBS+: Penicillin 3. Suspected macrosomia: pelvis previously tested to 7.5#, will monitor closely 4. Epidural upon patient request   Charlett Nose 04/26/2020, 10:35 AM

## 2020-04-26 NOTE — Anesthesia Procedure Notes (Signed)
Epidural Patient location during procedure: OB Start time: 04/26/2020 3:46 PM End time: 04/26/2020 3:54 PM  Staffing Anesthesiologist: Mellody Dance, MD Performed: anesthesiologist   Preanesthetic Checklist Completed: patient identified, IV checked, site marked, risks and benefits discussed, monitors and equipment checked, pre-op evaluation and timeout performed  Epidural Patient position: sitting Prep: DuraPrep Patient monitoring: heart rate, cardiac monitor, continuous pulse ox and blood pressure Approach: midline Location: L2-L3 Injection technique: LOR saline  Needle:  Needle type: Tuohy  Needle gauge: 17 G Needle length: 9 cm Needle insertion depth: 7.5 cm Catheter type: closed end flexible Catheter size: 20 Guage Catheter at skin depth: 13 cm Test dose: negative and Other  Assessment Events: blood not aspirated, injection not painful, no injection resistance and negative IV test  Additional Notes Informed consent obtained prior to proceeding including risk of failure, 1% risk of PDPH, risk of minor discomfort and bruising.  Discussed rare but serious complications including epidural abscess, permanent nerve injury, epidural hematoma.  Discussed alternatives to epidural analgesia and patient desires to proceed.  Timeout performed pre-procedure verifying patient name, procedure, and platelet count.  Patient tolerated procedure well.

## 2020-04-26 NOTE — Progress Notes (Addendum)
Received epidural, after which SROM clear fluid at 1630.  Since that time, having early decels with contractions with nadirs to 80s-90s, minimal to moderate variability. FSE placed by RN.   SVE by me: 5/60/-2  A/P 34Y G2P1001 @ 39.2, IOL - mostly early decels with some variable decels after epidural. Continuous monitoring. Continue position change PRN. If deeper variables, would consider IUPC with amnioinfusion - epidural in place - suspected macrosomia: shoulder precautions for delivery  Derl Barrow, MD 04/26/20 5:31 PM

## 2020-04-26 NOTE — Anesthesia Preprocedure Evaluation (Signed)
Anesthesia Evaluation  Patient identified by MRN, date of birth, ID band Patient awake    Reviewed: Allergy & Precautions, NPO status , Patient's Chart, lab work & pertinent test results  Airway Mallampati: III  TM Distance: >3 FB Neck ROM: Full    Dental no notable dental hx.    Pulmonary neg pulmonary ROS, former smoker,    Pulmonary exam normal breath sounds clear to auscultation       Cardiovascular Exercise Tolerance: Good negative cardio ROS Normal cardiovascular exam Rhythm:Regular Rate:Normal     Neuro/Psych negative neurological ROS  negative psych ROS   GI/Hepatic negative GI ROS, Neg liver ROS,   Endo/Other  Morbid obesity  Renal/GU negative Renal ROS  negative genitourinary   Musculoskeletal negative musculoskeletal ROS (+)   Abdominal   Peds negative pediatric ROS (+)  Hematology negative hematology ROS (+)   Anesthesia Other Findings   Reproductive/Obstetrics (+) Pregnancy                             Anesthesia Physical Anesthesia Plan  ASA: III  Anesthesia Plan: Epidural   Post-op Pain Management:    Induction:   PONV Risk Score and Plan: 2 and Treatment may vary due to age or medical condition  Airway Management Planned:   Additional Equipment:   Intra-op Plan:   Post-operative Plan:   Informed Consent: I have reviewed the patients History and Physical, chart, labs and discussed the procedure including the risks, benefits and alternatives for the proposed anesthesia with the patient or authorized representative who has indicated his/her understanding and acceptance.       Plan Discussed with: Anesthesiologist  Anesthesia Plan Comments:         Anesthesia Quick Evaluation

## 2020-04-26 NOTE — Progress Notes (Signed)
Into room for recurrent early/variable decels at 1800.  IUPC placed at 1810, exam 7/80/-1. Amnioinfusion started. Patient continued to have deep variable and early decelerations. The nadirs of the decelerations were becoming increasingly deep to 50-60s, with quick heart rate recovery with the end of contractions. Minimal to moderate variability between contractions. During this time, position was changed and IV fluids were bolused.   There was a prolonged deceleration (2.66min) at 1826 relating to two back-to-back contractions - at that time, pitocin was stopped and terbutaline was given. Patient was moved to hands and knees position. She was verbally consented for primary c-section in case the tracing did not improve, however decelerations resolved at 1835.  At this time, fetal heart racing is category 1 with moderate variability, accelerations, and no decelerations. Pitocin has been off for approximately 30 minutes. Will restart at 2mu/min.   Derl Barrow, MD 04/26/20 7:07 PM

## 2020-04-27 ENCOUNTER — Encounter (HOSPITAL_COMMUNITY): Payer: Self-pay | Admitting: Obstetrics and Gynecology

## 2020-04-27 LAB — CBC
HCT: 33.7 % — ABNORMAL LOW (ref 36.0–46.0)
Hemoglobin: 10.5 g/dL — ABNORMAL LOW (ref 12.0–15.0)
MCH: 23.9 pg — ABNORMAL LOW (ref 26.0–34.0)
MCHC: 31.2 g/dL (ref 30.0–36.0)
MCV: 76.8 fL — ABNORMAL LOW (ref 80.0–100.0)
Platelets: 141 10*3/uL — ABNORMAL LOW (ref 150–400)
RBC: 4.39 MIL/uL (ref 3.87–5.11)
RDW: 14.8 % (ref 11.5–15.5)
WBC: 12.4 10*3/uL — ABNORMAL HIGH (ref 4.0–10.5)
nRBC: 0 % (ref 0.0–0.2)

## 2020-04-27 MED ORDER — SENNOSIDES-DOCUSATE SODIUM 8.6-50 MG PO TABS
2.0000 | ORAL_TABLET | ORAL | Status: DC
  Administered 2020-04-27 – 2020-04-28 (×2): 2 via ORAL
  Filled 2020-04-27 (×2): qty 2

## 2020-04-27 MED ORDER — ONDANSETRON HCL 4 MG PO TABS: 4.0000 mg | ORAL_TABLET | ORAL | Status: DC | PRN

## 2020-04-27 MED ORDER — COCONUT OIL OIL: 1.0000 "application " | TOPICAL_OIL | Status: DC | PRN

## 2020-04-27 MED ORDER — ACETAMINOPHEN 325 MG PO TABS: 650.0000 mg | ORAL_TABLET | ORAL | Status: DC | PRN

## 2020-04-27 MED ORDER — BENZOCAINE-MENTHOL 20-0.5 % EX AERO: 1.0000 "application " | INHALATION_SPRAY | CUTANEOUS | Status: DC | PRN

## 2020-04-27 MED ORDER — DOCUSATE SODIUM 100 MG PO CAPS
100.0000 mg | ORAL_CAPSULE | Freq: Two times a day (BID) | ORAL | Status: DC
  Administered 2020-04-27 – 2020-04-28 (×3): 100 mg via ORAL
  Filled 2020-04-27 (×5): qty 1

## 2020-04-27 MED ORDER — OXYCODONE HCL 5 MG PO TABS: 10.0000 mg | ORAL_TABLET | ORAL | Status: DC | PRN

## 2020-04-27 MED ORDER — DIBUCAINE (PERIANAL) 1 % EX OINT: 1.0000 "application " | TOPICAL_OINTMENT | CUTANEOUS | Status: DC | PRN

## 2020-04-27 MED ORDER — OXYCODONE HCL 5 MG PO TABS: 5.0000 mg | ORAL_TABLET | ORAL | Status: DC | PRN

## 2020-04-27 MED ORDER — DIPHENHYDRAMINE HCL 25 MG PO CAPS: 25.0000 mg | ORAL_CAPSULE | Freq: Four times a day (QID) | ORAL | Status: DC | PRN

## 2020-04-27 MED ORDER — SIMETHICONE 80 MG PO CHEW: 80.0000 mg | CHEWABLE_TABLET | ORAL | Status: DC | PRN

## 2020-04-27 MED ORDER — MAGNESIUM HYDROXIDE 400 MG/5ML PO SUSP: 30.0000 mL | ORAL | Status: DC | PRN

## 2020-04-27 MED ORDER — PRENATAL MULTIVITAMIN CH
1.0000 | ORAL_TABLET | Freq: Every day | ORAL | Status: DC
  Administered 2020-04-27 – 2020-04-28 (×2): 1 via ORAL
  Filled 2020-04-27 (×2): qty 1

## 2020-04-27 MED ORDER — ZOLPIDEM TARTRATE 5 MG PO TABS: 5.0000 mg | ORAL_TABLET | Freq: Every evening | ORAL | Status: DC | PRN

## 2020-04-27 MED ORDER — IBUPROFEN 600 MG PO TABS
600.0000 mg | ORAL_TABLET | Freq: Four times a day (QID) | ORAL | Status: DC
  Administered 2020-04-27 – 2020-04-28 (×6): 600 mg via ORAL
  Filled 2020-04-27 (×6): qty 1

## 2020-04-27 MED ORDER — ONDANSETRON HCL 4 MG/2ML IJ SOLN: 4.0000 mg | INTRAMUSCULAR | Status: DC | PRN

## 2020-04-27 MED ORDER — WITCH HAZEL-GLYCERIN EX PADS: 1.0000 "application " | MEDICATED_PAD | CUTANEOUS | Status: DC | PRN

## 2020-04-27 NOTE — Anesthesia Postprocedure Evaluation (Signed)
Anesthesia Post Note  Patient: Brenda Lane  Procedure(s) Performed: AN AD HOC LABOR EPIDURAL     Anesthesia Post Evaluation No complications documented.  Last Vitals:  Vitals:   04/27/20 0015 04/27/20 0045  BP: 137/76 135/75  Pulse: 89 91  Resp: 18 17  Temp:      Last Pain:  Vitals:   04/27/20 0045  TempSrc:   PainSc: 0-No pain   Pain Goal:                   Angell Pincock

## 2020-04-27 NOTE — Progress Notes (Signed)
Post Partum Day 1 Subjective: no complaints, up ad lib, voiding and tolerating PO  Objective: Patient Vitals for the past 24 hrs:  BP Temp Temp src Pulse Resp SpO2 Height Weight  04/27/20 0757 136/71 98.6 F (37 C) -- 71 20 -- -- --  04/27/20 0300 129/76 98.2 F (36.8 C) Oral 79 16 100 % -- --  04/27/20 0150 139/86 98.1 F (36.7 C) Oral 88 16 98 % -- --  04/27/20 0115 130/65 97.8 F (36.6 C) Oral 90 17 -- -- --  04/27/20 0100 140/64 -- -- 86 18 -- -- --  04/27/20 0045 135/75 -- -- 91 17 -- -- --  04/27/20 0015 137/76 -- -- 89 18 -- -- --  04/26/20 2317 134/72 -- -- (!) 101 18 -- -- --  04/26/20 2231 (!) 138/91 -- -- (!) 121 -- -- -- --  04/26/20 2201 (!) 113/56 -- -- 84 -- -- -- --  04/26/20 2131 (!) 127/48 -- -- 78 20 -- -- --  04/26/20 2101 (!) 119/49 -- -- 83 18 -- -- --  04/26/20 2030 135/75 -- -- (!) 101 20 -- -- --  04/26/20 2001 (!) 134/55 98.2 F (36.8 C) Oral (!) 102 16 -- -- --  04/26/20 1930 (!) 133/56 -- -- 85 18 -- -- --  04/26/20 1900 (!) 135/56 -- -- 92 18 -- -- --  04/26/20 1800 139/71 98.9 F (37.2 C) Oral 73 16 -- -- --  04/26/20 1625 129/74 -- -- 92 19 -- -- --  04/26/20 1620 (!) 117/96 -- -- 95 18 -- -- --  04/26/20 1615 130/68 -- -- 82 17 -- -- --  04/26/20 1610 120/69 -- -- 79 18 -- -- --  04/26/20 1605 132/71 -- -- 85 17 -- -- --  04/26/20 1600 129/69 -- -- 86 17 -- -- --  04/26/20 1555 134/73 -- -- 80 18 -- -- --  04/26/20 1550 132/72 -- -- 77 18 -- -- --  04/26/20 1514 130/72 -- -- 76 19 -- -- --  04/26/20 1430 128/75 -- -- 88 20 -- -- --  04/26/20 1254 139/67 98.2 F (36.8 C) Oral 86 20 -- -- --  04/26/20 1132 133/80 -- -- 78 18 -- -- --  04/26/20 1100 122/70 -- -- 80 16 -- -- --  04/26/20 1027 126/85 -- -- 78 -- -- -- --  04/26/20 0953 (!) 122/91 -- -- 81 16 -- -- --  04/26/20 0852 136/83 98.4 F (36.9 C) Oral 90 18 -- 5\' 6"  (1.676 m) 115.7 kg    Physical Exam:  General: alert, cooperative and no distress Lochia: appropriate Uterine  Fundus: firm DVT Evaluation: No evidence of DVT seen on physical exam.  Recent Labs    04/26/20 0852 04/27/20 0503  WBC 11.5* 12.4*  HGB 11.6* 10.5*  HCT 37.4 33.7*  PLT 166 141*    No results for input(s): NA, K, CL, CO2CT, BUN, CREATININE, GLUCOSE, BILITOT, ALT, AST, ALKPHOS, PROT, ALBUMIN in the last 72 hours.  No results for input(s): CALCIUM, MG, PHOS in the last 72 hours.  No results for input(s): PROTIME, APTT, INR in the last 72 hours.  No results for input(s): PROTIME, APTT, INR, FIBRINOGEN in the last 72 hours. Assessment/Plan:  Brenda Lane 34 y.o. G2P2002 PPD#1 sp SVD 1. PPC: continue routine postpartum care  2. Desires circumcision: Desires neonatal circumcision, R/B/A of procedure discussed at length. Pt understands that neonatal circumcision is not considered medically necessary and is elective.  The risks include, but are not limited to bleeding, infection, damage to the penis, development of scar tissue, and having to have it redone at a later date. Pt understands theses risks and wishes to proceed. Awaiting void and clearance by peds. 3. Discharge today vs. Tomorrow AM    LOS: 1 day   Charlett Nose 04/27/2020, 8:20 AM

## 2020-04-27 NOTE — Addendum Note (Signed)
Addendum  created 04/27/20 0801 by Algis Greenhouse, CRNA   Charge Capture section accepted, Clinical Note Signed

## 2020-04-27 NOTE — Anesthesia Postprocedure Evaluation (Signed)
Anesthesia Post Note  Patient: Brenda Lane  Procedure(s) Performed: AN AD HOC LABOR EPIDURAL     Patient location during evaluation: Mother Baby Anesthesia Type: Epidural Level of consciousness: awake Pain management: satisfactory to patient Vital Signs Assessment: post-procedure vital signs reviewed and stable Respiratory status: spontaneous breathing Cardiovascular status: stable Anesthetic complications: no   No complications documented.  Last Vitals:  Vitals:   04/27/20 0300 04/27/20 0757  BP: 129/76 136/71  Pulse: 79 71  Resp: 16 20  Temp: 36.8 C 37 C  SpO2: 100%     Last Pain:  Vitals:   04/27/20 0300  TempSrc: Oral  PainSc: 0-No pain   Pain Goal:                   [employer]

## 2020-04-27 NOTE — Anesthesia Postprocedure Evaluation (Signed)
Anesthesia Post Note  Patient: Brenda Lane  Procedure(s) Performed: AN AD HOC LABOR EPIDURAL     Patient location during evaluation: Mother Baby Anesthesia Type: Epidural Level of consciousness: awake and alert Pain management: pain level controlled Vital Signs Assessment: post-procedure vital signs reviewed and stable Respiratory status: spontaneous breathing, nonlabored ventilation and respiratory function stable Cardiovascular status: stable Postop Assessment: no headache, no backache and epidural receding Anesthetic complications: no   No complications documented.  Last Vitals:  Vitals:   04/27/20 0015 04/27/20 0045  BP: 137/76 135/75  Pulse: 89 91  Resp: 18 17  Temp:      Last Pain:  Vitals:   04/27/20 0045  TempSrc:   PainSc: 0-No pain   Pain Goal:                   Khamron Gellert

## 2020-04-28 MED ORDER — ACETAMINOPHEN 325 MG PO TABS: 650.0000 mg | ORAL_TABLET | Freq: Four times a day (QID) | ORAL | 0 refills | Status: AC | PRN

## 2020-04-28 MED ORDER — IBUPROFEN 600 MG PO TABS: 600.0000 mg | ORAL_TABLET | Freq: Four times a day (QID) | ORAL | 0 refills | Status: AC

## 2020-04-28 NOTE — Progress Notes (Signed)
Post Partum Day 2 Subjective: no complaints, up ad lib, voiding and tolerating PO  Objective: Patient Vitals for the past 24 hrs:  BP Temp Temp src Pulse Resp SpO2  04/28/20 0544 126/84 98 F (36.7 C) Oral 72 18 100 %  04/27/20 2035 132/79 98.8 F (37.1 C) Oral 84 18 100 %  04/27/20 1331 131/66 97.9 F (36.6 C) -- 77 20 --  04/27/20 0757 136/71 98.6 F (37 C) -- 71 20 --    Physical Exam:  General: alert, cooperative and no distress Lochia: appropriate Uterine Fundus: firm DVT Evaluation: No evidence of DVT seen on physical exam.  Recent Labs    04/26/20 0852 04/27/20 0503  WBC 11.5* 12.4*  HGB 11.6* 10.5*  HCT 37.4 33.7*  PLT 166 141*    No results for input(s): NA, K, CL, CO2CT, BUN, CREATININE, GLUCOSE, BILITOT, ALT, AST, ALKPHOS, PROT, ALBUMIN in the last 72 hours.  No results for input(s): CALCIUM, MG, PHOS in the last 72 hours.  No results for input(s): PROTIME, APTT, INR in the last 72 hours.  No results for input(s): PROTIME, APTT, INR, FIBRINOGEN in the last 72 hours. Assessment/Plan: Discharge home and Circumcision prior to discharge  Calissa Swenor 34 y.o. [id number] PPD#2 sp SVD 1. PPC: continue routine postpartum care 2. Circ today - consent reviewed yesterday 3. RH pos 4. Plan for discharge today. Discharge instructions given. Follow up in 4 weeks.    LOS: 2 days   Charlett Nose 04/28/2020, 7:36 AM

## 2020-04-28 NOTE — Discharge Summary (Signed)
Postpartum Discharge Summary      Patient Name: Brenda Lane DOB: [birth year] MRN: [id number]  Date of admission: 04/26/2020 Delivery date:04/26/2020  Delivering provider: Irene Pap E  Date of discharge: 04/28/2020  Admitting diagnosis: Encounter for elective induction of labor [Z34.90] Intrauterine pregnancy: [redacted]w[redacted]d    Secondary diagnosis:  Active Problems:   Encounter for elective induction of labor    Discharge diagnosis: Term Pregnancy Delivered                                              Post partum procedures:none Augmentation: AROM and Pitocin Complications: None  Hospital course: Induction of Labor With Vaginal Delivery   34y.o. yo G2P2002 at 32w2das admitted to the hospital 04/26/2020 for induction of labor.  Indication for induction: Elective.  Patient had an uncomplicated labor course as follows: Membrane Rupture Time/Date: 4:30 PM ,04/26/2020   Delivery Method:Vaginal, Spontaneous  Episiotomy: None  Lacerations:  None  Details of delivery can be found in separate delivery note.  Patient had a routine postpartum course. Patient is discharged home 04/28/20.  Newborn Data: Birth date:04/26/2020  Birth time:10:59 PM  Gender:Female  Living status:Living  Apgars:9 ,9  Weight:3334 g   Magnesium Sulfate received: No BMZ received: No Rhophylac:N/A MMR:N/A T-DaP:Given prenatally Flu: No Transfusion:No  Physical exam  Vitals:   04/27/20 0757 04/27/20 1331 04/27/20 2035 04/28/20 0544  BP: 136/71 131/66 132/79 126/84  Pulse: 71 77 84 72  Resp: 20 20 18 18   Temp: 98.6 F (37 C) 97.9 F (36.6 C) 98.8 F (37.1 C) 98 F (36.7 C)  TempSrc:   Oral Oral  SpO2:   100% 100%  Weight:      Height:       General: alert, cooperative and no distress Lochia: appropriate Uterine Fundus: firm Incision: N/A DVT Evaluation: No evidence of DVT seen on physical exam. Labs: Lab Results  Component Value Date   WBC 12.4 (H) 04/27/2020   HGB 10.5 (L)  04/27/2020   HCT 33.7 (L) 04/27/2020   MCV 76.8 (L) 04/27/2020   PLT 141 (L) 04/27/2020   No flowsheet data found. Edinburgh Score: Edinburgh Postnatal Depression Scale Screening Tool 12/25/2018  I have been able to laugh and see the funny side of things. 0  I have looked forward with enjoyment to things. 0  I have blamed myself unnecessarily when things went wrong. 0  I have been anxious or worried for no good reason. 0  I have felt scared or panicky for no good reason. 0  Things have been getting on top of me. 0  I have been so unhappy that I have had difficulty sleeping. 0  I have felt sad or miserable. 0  I have been so unhappy that I have been crying. 0  The thought of harming myself has occurred to me. 0  Edinburgh Postnatal Depression Scale Total 0      After visit meds:  Allergies as of 04/28/2020   No Known Allergies     Medication List    STOP taking these medications   aspirin EC 81 MG tablet   PHENERGAN PO   prenatal multivitamin Tabs tablet        Discharge home in stable condition Infant Feeding: Breast Infant Disposition:home with mother Discharge instruction: per After Visit Summary and Postpartum booklet. Activity: Advance as  tolerated. Pelvic rest for 6 weeks.  Diet: routine diet Anticipated Birth Control: vasectomy Postpartum Appointment:4 weeks Additional Postpartum F/U: none Future Appointments:No future appointments.     04/28/2020 Rowland Lathe, MD

## 2021-11-15 IMAGING — US US MFM OB DETAIL+14 WK
1 series · 13 of 28 positions shown · non-contrast
Comparison: none

[Series 1: us mfm ob detail+14 wk · 69 acquisitions, 13 frames shown]
[im 3/69]
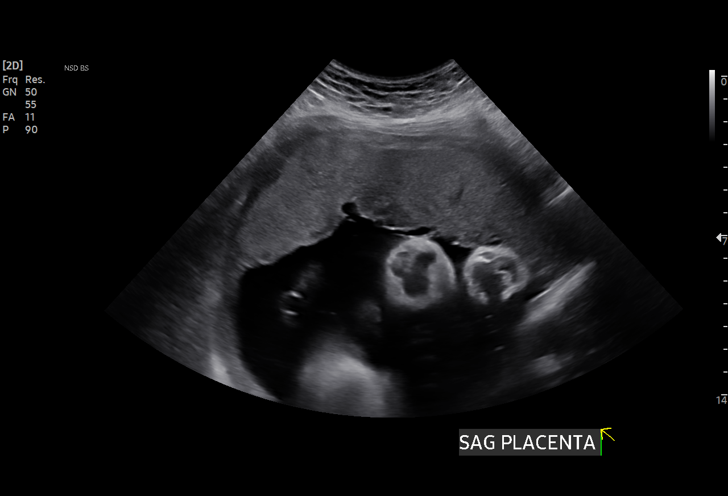
[im 8/69]
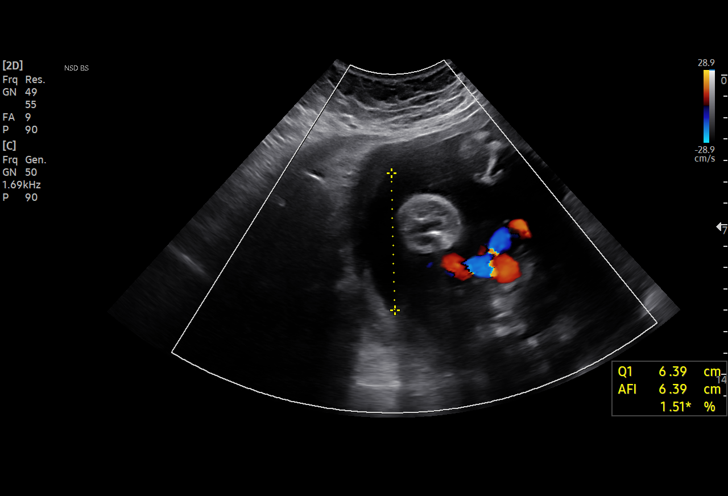
[im 13/69]
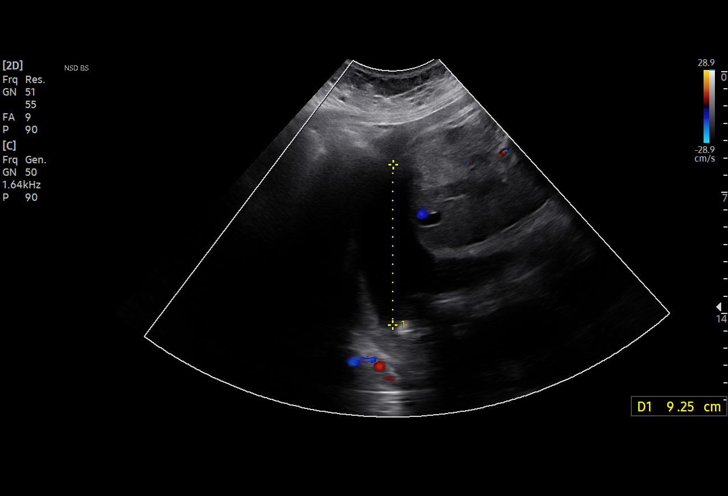
[im 18/69]
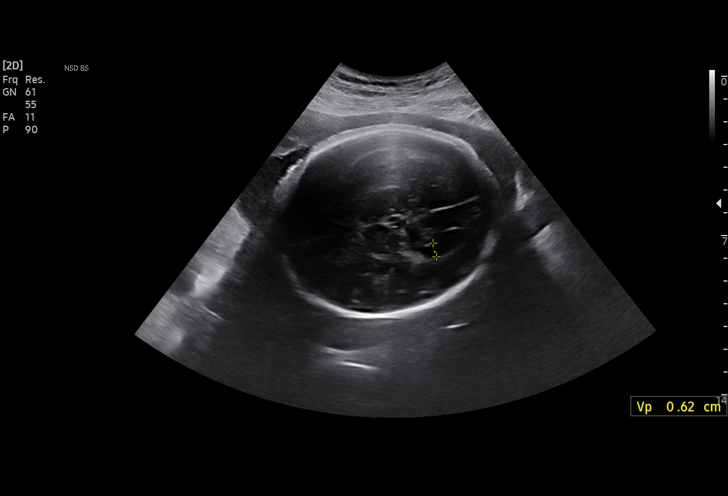
[im 23/69]
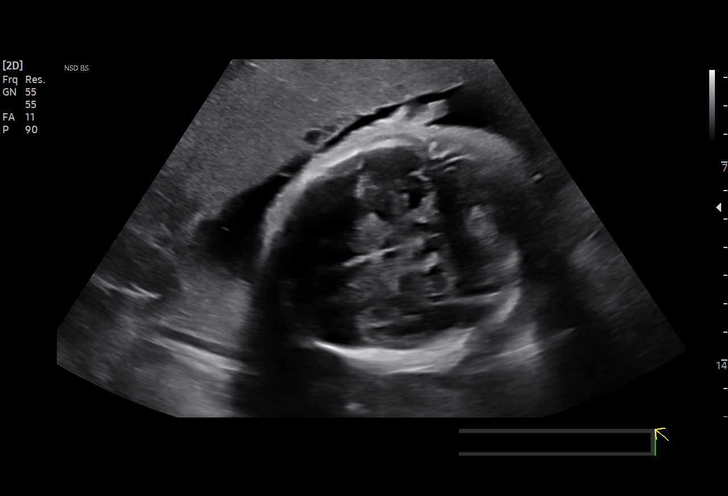
[im 28/69]
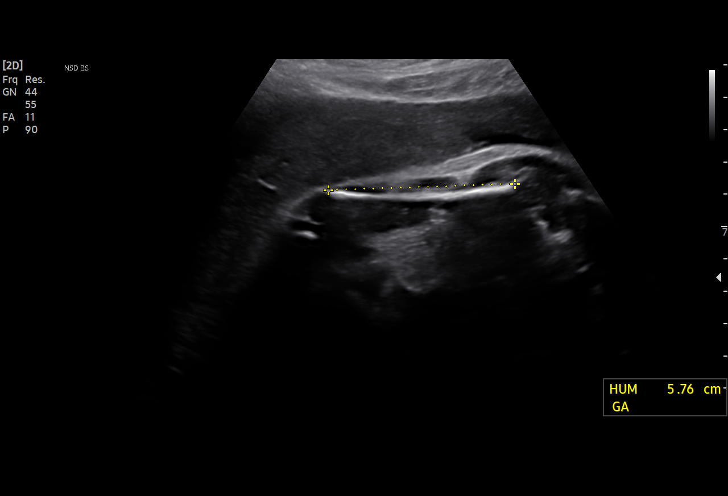
[im 36/69]
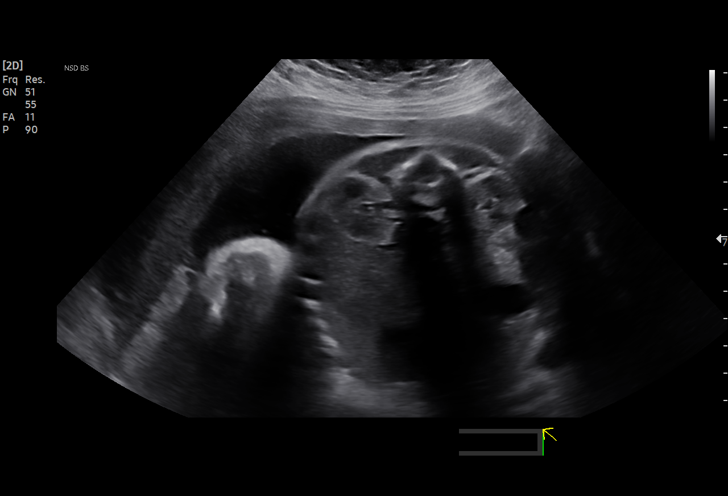
[im 41/69]
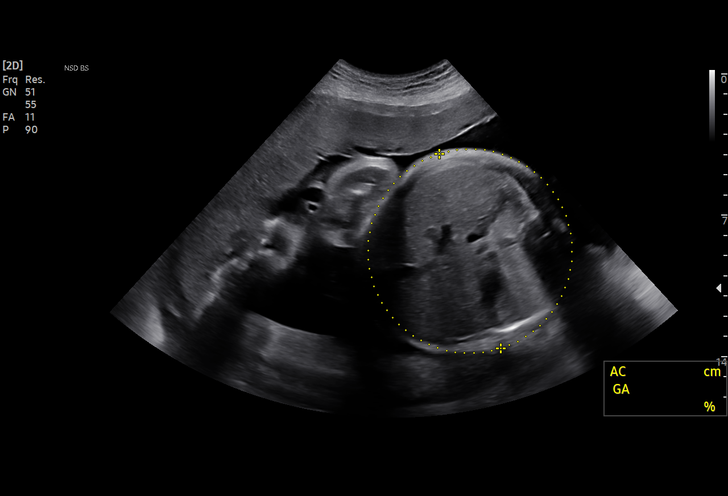
[im 46/69]
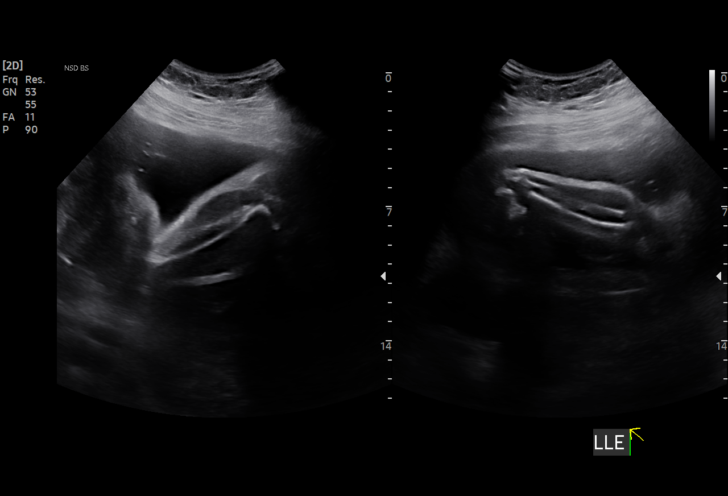
[im 51/69]
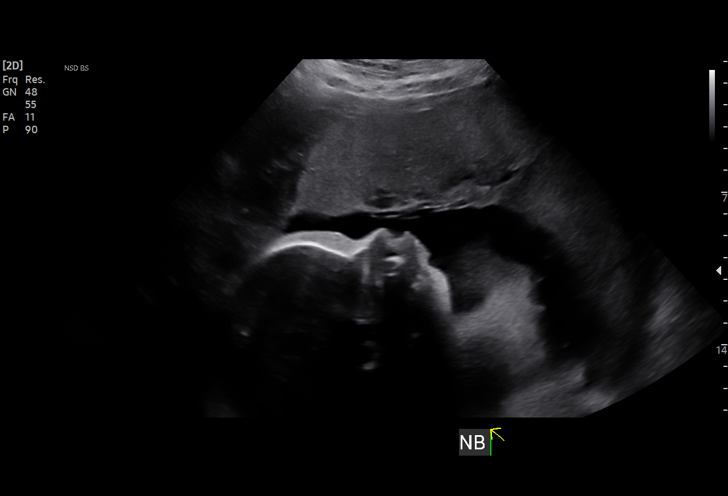
[im 56/69]
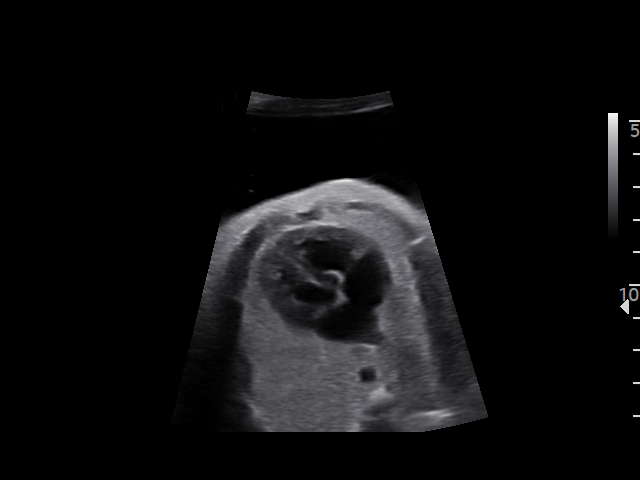
[im 61/69]
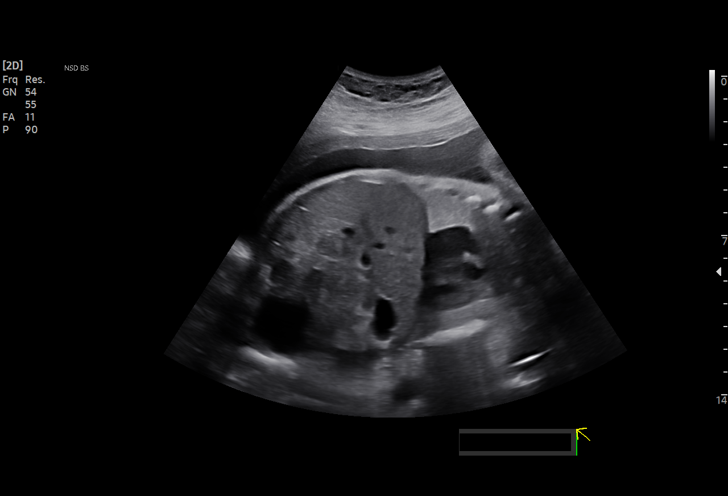
[im 66/69]
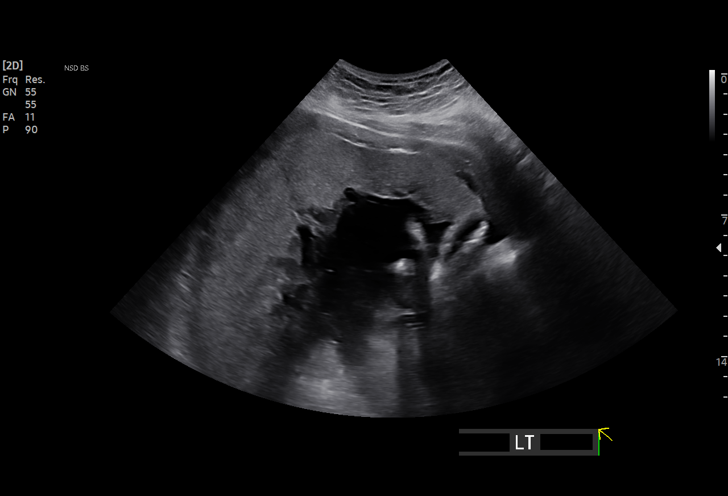

[13 of 28 positions shown; findings below may reference images not displayed]

Road; [HOSPITAL]

 1  US MFM OB DETAIL +14 WK               768[address].01    NAZARETH JUMPER

Indications

 Polyhydramnios, third trimester, antepartum
 condition or complication, unspecified fetus
 33 weeks gestation of pregnancy
 Encounter for antenatal screening for
 malformations (Neg AFP  NEG LatW4)
 Obesity complicating pregnancy, third
 trimester (BMI 39)
Fetal Evaluation

 Num Of Fetuses:         1
 Fetal Heart Rate(bpm):  153
 Cardiac Activity:       Observed
 Presentation:           Cephalic
 Placenta:               Anterior
 P. Cord Insertion:      Visualized

 Amniotic Fluid
 AFI FV:      Within normal limits

 AFI Sum(cm)     %Tile       Largest Pocket(cm)
 17.68           65

 RUQ(cm)       RLQ(cm)       LUQ(cm)        LLQ(cm)

Biometry
 BPD:      80.2  mm     G. Age:  32w 1d         21  %    CI:        73.47   %    70 - 86
                                                         FL/HC:      21.8   %    19.9 -
 HC:      297.3  mm     G. Age:  32w 6d         13  %    HC/AC:      0.93        0.96 -
 AC:      318.7  mm     G. Age:  35w 6d         99  %    FL/BPD:     80.7   %    71 - 87
 FL:       64.7  mm     G. Age:  33w 3d         49  %    FL/AC:      20.3   %    20 - 24
 HUM:        58  mm     G. Age:  33w 4d         73  %

 LV:        6.2  mm

 Est. FW:    2787  gm      5 lb 6 oz     84  %
OB History

 Gravidity:    2         Term:   1        Prem:   0        SAB:   0
 TOP:          0       Ectopic:  0        Living: 1
Gestational Age

 LMP:           33w 0d        Date:  07/26/19                 EDD:   05/01/20
 U/S Today:     33w 4d                                        EDD:   04/27/20
 Best:          33w 0d     Det. By:  LMP  (07/26/19)          EDD:   05/01/20
Anatomy

 Cranium:               Appears normal         LVOT:                   Appears normal
 Cavum:                 Appears normal         Aortic Arch:            Not well visualized
 Ventricles:            Appears normal         Ductal Arch:            Not well visualized
 Choroid Plexus:        Not well visualized    Diaphragm:              Appears normal
 Cerebellum:            Appears normal         Stomach:                Appears normal, left
                                                                       sided
 Posterior Fossa:       Appears normal         Abdomen:                Appears normal
 Nuchal Fold:           Not well visualized    Abdominal Wall:         Appears nml (cord
                                                                       insert, abd wall)
 Face:                  Orbits appear          Cord Vessels:           Appears normal (3
                        normal                                         vessel cord)
 Lips:                  Appears normal         Kidneys:                Appear normal
 Palate:                Not well visualized    Bladder:                Appears normal
 Thoracic:              Appears normal         Spine:                  Limited views
                                                                       appear normal
 Heart:                 Appears normal         Upper Extremities:      Visualized
                        (4CH, axis, and
                        situs)
 RVOT:                  Appears normal         Lower Extremities:      Visualized

 Other:  Fetus appears to be a male. Technically difficult due to advanced GA
         and fetal position.
Cervix Uterus Adnexa

 Cervix
 Not visualized (advanced GA >16wks)

 Right Ovary
 Within normal limits.

 Left Ovary
 Within normal limits.
 Adnexa
 No abnormality visualized.
Impression

 Ms. Shoemake was sent by Dr. Makhija to ultrasound and
 evaluation of polyhydramnios.
 A single intrauterine pregnancy was observed with
 measurements consistent with dates.
 There was an AFI of 18 today and no MVP > 8 suggesting
 normal amniotic fluid. There as good fetal movement as well.

 I reviewed the diagnoses, evalauation and management of
 polyhydramnios. I reviewed that she did not meet the criteria.
 She does not have a gestational diabetes she reported a
 normal 1hr GTT.

 I discussed today's findings with Dr. Makhija and he agreed
 with the plan for no follow up at our office but a repeat growth
 in 4 weeks.
Recommendations

 Follow up at your offices in 3-4 weeks.
